# Patient Record
Sex: Female | Born: 1995
Health system: Southern US, Community
[De-identification: ages and names within clinical notes are randomized; demographics above are authoritative.]

## PROBLEM LIST (undated history)

## (undated) DIAGNOSIS — T7840XA Allergy, unspecified, initial encounter: Secondary | ICD-10-CM

## (undated) DIAGNOSIS — Z803 Family history of malignant neoplasm of breast: Secondary | ICD-10-CM

## (undated) DIAGNOSIS — J40 Bronchitis, not specified as acute or chronic: Secondary | ICD-10-CM

## (undated) DIAGNOSIS — G43909 Migraine, unspecified, not intractable, without status migrainosus: Secondary | ICD-10-CM

## (undated) DIAGNOSIS — Z8041 Family history of malignant neoplasm of ovary: Secondary | ICD-10-CM

## (undated) HISTORY — DX: Family history of malignant neoplasm of breast: Z80.3

## (undated) HISTORY — DX: Family history of malignant neoplasm of ovary: Z80.41

## (undated) HISTORY — DX: Migraine, unspecified, not intractable, without status migrainosus: G43.909

## (undated) HISTORY — DX: Allergy, unspecified, initial encounter: T78.40XA

---
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
## 2017-10-13 ENCOUNTER — Ambulatory Visit: Payer: Self-pay | Admitting: Physician Assistant

## 2018-08-30 ENCOUNTER — Emergency Department (HOSPITAL_COMMUNITY)
Admission: EM | Admit: 2018-08-30 | Discharge: 2018-08-30 | Disposition: A | Discharge status: Home / Self Care | Payer: No Typology Code available for payment source | Attending: Emergency Medicine | Admitting: Emergency Medicine

## 2018-08-30 ENCOUNTER — Emergency Department (HOSPITAL_COMMUNITY): Payer: No Typology Code available for payment source

## 2018-08-30 ENCOUNTER — Encounter (HOSPITAL_COMMUNITY): Payer: Self-pay | Admitting: Emergency Medicine

## 2018-08-30 ENCOUNTER — Other Ambulatory Visit: Payer: Self-pay

## 2018-08-30 DIAGNOSIS — M545 Low back pain, unspecified: Secondary | ICD-10-CM

## 2018-08-30 DIAGNOSIS — M542 Cervicalgia: Secondary | ICD-10-CM | POA: Diagnosis not present

## 2018-08-30 LAB — POC URINE PREG, ED: Preg Test, Ur: NEGATIVE

## 2018-08-30 NOTE — ED Notes (Signed)
Patient verbalizes understanding of discharge instructions. Opportunity for questioning and answers were provided. Armband removed by staff, pt discharged from ED. Ambulated out to lobby  

## 2018-08-30 NOTE — ED Notes (Signed)
Patient transported to X-ray 
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
## 2018-08-30 NOTE — Discharge Instructions (Addendum)

## 2019-03-16 ENCOUNTER — Encounter: Payer: Self-pay | Admitting: Emergency Medicine

## 2019-03-16 ENCOUNTER — Emergency Department
Admission: EM | Admit: 2019-03-16 | Discharge: 2019-03-16 | Disposition: A | Discharge status: Home / Self Care | Payer: Medicaid Other | Attending: Emergency Medicine | Admitting: Emergency Medicine

## 2019-03-16 ENCOUNTER — Other Ambulatory Visit: Payer: Self-pay

## 2019-03-16 DIAGNOSIS — H9202 Otalgia, left ear: Secondary | ICD-10-CM

## 2019-03-16 HISTORY — DX: Bronchitis, not specified as acute or chronic: J40

## 2019-03-16 NOTE — Discharge Instructions (Addendum)
You were seen today for left ear pain. There is no sign of infection. Start Flonase 1 spray left nare BID x 3 days. Can also use Sweet Oil in left ear and then plug with a cotton ball for comfort. Avoid use of Qtips. Follow up if pain persists or worsens.

## 2019-03-16 NOTE — ED Provider Notes (Signed)
[facility] Department Provider Note ____________________________________________  Time seen: 2051  I have reviewed the triage vital signs and the nursing notes.  HISTORY  Chief Complaint  Otalgia   HPI Karen Benton is a 23 y.o. female presents to the ER today with complaint of left ear pain.  She reports this started 2 to 3 days ago.  She reports the pain is minor.  She feels like she may have had fluid in her ear.  She denies decreased hearing or drainage from the left ear.  She denies runny nose, nasal congestion, ear pain, sore throat, cough or shortness of breath.  She denies fever, chills or body aches.  She has not taken anything over-the-counter for her symptoms.  Past Medical History:  Diagnosis Date  . Bronchitis     There are no active problems to display for this patient.   History reviewed. No pertinent surgical history.  Prior to Admission medications   Not on File    Allergies Patient has no known allergies.  History reviewed. No pertinent family history.  Social History Social History   Tobacco Use  . Smoking status: Never Smoker  . Smokeless tobacco: Never Used  Substance Use Topics  . Alcohol use: Yes    Frequency: Never  . Drug use: Never    Review of Systems  Constitutional: Negative for fever chills or body aches. ENT: Positive for ear pain.  Negative for runny nose, nasal congestion or sore throat. Cardiovascular: Negative for chest pain or chest tightness. Respiratory: Negative for cough or shortness of breath. Neurological: Negative for headaches, focal weakness or numbness. ____________________________________________  PHYSICAL EXAM:  VITAL SIGNS: ED Triage Vitals  Enc Vitals Group     BP 03/16/19 1943 139/67     Pulse Rate 03/16/19 1943 80     Resp 03/16/19 1943 18     Temp 03/16/19 1943 98.4 F (36.9 C)     Temp Source 03/16/19 1943 Oral     SpO2 03/16/19 1943 98 %     Weight 03/16/19  1944 170 lb (77.1 kg)     Height 03/16/19 1944 5\' 3"  (1.6 m)     Head Circumference --      Peak Flow --      Pain Score 03/16/19 1947 3     Pain Loc --      Pain Edu? --      Excl. in [place]? --     Constitutional: Alert and oriented. Well appearing and in no distress. Head: Normocephalic and atraumatic. Eyes: Conjunctivae are normal. PERRL. Normal extraocular movements Ears: Canals clear. TMs intact bilaterally.  + Effusion on the left. Dried blood noted in left outer ear canal, no active bleeding noted. Nose: No congestion/rhinorrhea/epistaxis. Mouth/Throat: Mucous membranes are moist. Hematological/Lymphatic/Immunological: No cervical lymphadenopathy. Cardiovascular: Normal rate, regular rhythm.  Respiratory: Normal respiratory effort. No wheezes/rales/rhonchi. Neurologic:  Normal gait without ataxia. Normal speech and language. No gross focal neurologic deficits are appreciated. Skin:  Skin is warm, dry and intact. No rash noted.  ______________________________________________  INITIAL IMPRESSION / ASSESSMENT AND PLAN / ED COURSE  Otalgia, Left Ear:  Appears to be trauma for possible Qtip No indication for abx at this time Start Flonase 1 spray left nare BID x 3 days Can use Sweet Oil and cotton ball in left ear for comfort ____________________________________________  FINAL CLINICAL IMPRESSION(S) / ED DIAGNOSES  Final diagnoses:  Otalgia of left ear   Nicki Reaperegina Baity, NP    [place]ity,  Coralie Keens, NP 03/16/19 2056    Arta Silence, MD 03/17/19 [phone]

## 2019-03-16 NOTE — ED Triage Notes (Signed)
Pt reports left earache for several days, worsening; no drainage; no fevers; pt in no acute distress

## 2020-01-07 ENCOUNTER — Encounter: Payer: Medicaid Other | Admitting: Obstetrics and Gynecology

## 2020-01-08 ENCOUNTER — Encounter: Payer: Self-pay | Admitting: Emergency Medicine

## 2020-01-08 ENCOUNTER — Emergency Department
Admission: EM | Admit: 2020-01-08 | Discharge: 2020-01-08 | Disposition: A | Discharge status: Home / Self Care | Payer: Medicaid Other | Source: Home / Self Care | Attending: Emergency Medicine | Admitting: Emergency Medicine

## 2020-01-08 ENCOUNTER — Other Ambulatory Visit: Payer: Self-pay

## 2020-01-08 ENCOUNTER — Emergency Department
Admission: EM | Admit: 2020-01-08 | Discharge: 2020-01-08 | Disposition: A | Discharge status: Home / Self Care | Payer: Medicaid Other | Attending: Student | Admitting: Student

## 2020-01-08 ENCOUNTER — Emergency Department: Payer: Medicaid Other

## 2020-01-08 DIAGNOSIS — Z3202 Encounter for pregnancy test, result negative: Secondary | ICD-10-CM | POA: Diagnosis not present

## 2020-01-08 DIAGNOSIS — N939 Abnormal uterine and vaginal bleeding, unspecified: Secondary | ICD-10-CM

## 2020-01-08 DIAGNOSIS — R102 Pelvic and perineal pain: Secondary | ICD-10-CM | POA: Insufficient documentation

## 2020-01-08 LAB — COMPREHENSIVE METABOLIC PANEL
ALT: 18 U/L (ref 0–44)
AST: 19 U/L (ref 15–41)
Albumin: 3.8 g/dL (ref 3.5–5.0)
Alkaline Phosphatase: 33 U/L — ABNORMAL LOW (ref 38–126)
Anion gap: 9 (ref 5–15)
BUN: 18 mg/dL (ref 6–20)
CO2: 29 mmol/L (ref 22–32)
Calcium: 9 mg/dL (ref 8.9–10.3)
Chloride: 105 mmol/L (ref 98–111)
Creatinine, Ser: 0.78 mg/dL (ref 0.44–1.00)
GFR calc Af Amer: >60 mL/min (ref >60)
GFR calc non Af Amer: >60 mL/min (ref >60)
Glucose, Bld: 103 mg/dL — ABNORMAL HIGH (ref 70–99)
Potassium: 3.9 mmol/L (ref 3.5–5.1)
Sodium: 143 mmol/L (ref 135–145)
Total Bilirubin: 0.4 mg/dL (ref 0.3–1.2)
Total Protein: 6.8 g/dL (ref 6.5–8.1)

## 2020-01-08 LAB — CBC WITH DIFFERENTIAL/PLATELET
Abs Immature Granulocytes: 0.04 10*3/uL (ref 0.00–0.07)
Basophils Absolute: 0 10*3/uL (ref 0.0–0.1)
Basophils Relative: 1 %
Eosinophils Absolute: 0.1 10*3/uL (ref 0.0–0.5)
Eosinophils Relative: 2 %
HCT: 39.1 % (ref 36.0–46.0)
Hemoglobin: 13.3 g/dL (ref 12.0–15.0)
Immature Granulocytes: 1 %
Lymphocytes Relative: 43 %
Lymphs Abs: 2.9 10*3/uL (ref 0.7–4.0)
MCH: 30.6 pg (ref 26.0–34.0)
MCHC: 34 g/dL (ref 30.0–36.0)
MCV: 89.9 fL (ref 80.0–100.0)
Monocytes Absolute: 0.5 10*3/uL (ref 0.1–1.0)
Monocytes Relative: 7 %
Neutro Abs: 3.3 10*3/uL (ref 1.7–7.7)
Neutrophils Relative %: 46 %
Platelets: 269 10*3/uL (ref 150–400)
RBC: 4.35 MIL/uL (ref 3.87–5.11)
RDW: 12.1 % (ref 11.5–15.5)
WBC: 6.9 10*3/uL (ref 4.0–10.5)
nRBC: 0 % (ref 0.0–0.2)

## 2020-01-08 LAB — URINALYSIS, COMPLETE (UACMP) WITH MICROSCOPIC
Bacteria, UA: NONE SEEN
Bilirubin Urine: NEGATIVE
Glucose, UA: NEGATIVE mg/dL
Ketones, ur: NEGATIVE mg/dL
Leukocytes,Ua: NEGATIVE
Nitrite: NEGATIVE
Protein, ur: NEGATIVE mg/dL
Specific Gravity, Urine: 1.027 (ref 1.005–1.030)
pH: 6 (ref 5.0–8.0)

## 2020-01-08 LAB — ABO/RH: ABO/RH(D): A POS

## 2020-01-08 LAB — POCT PREGNANCY, URINE: Preg Test, Ur: NEGATIVE

## 2020-01-08 LAB — HCG, QUANTITATIVE, PREGNANCY: hCG, Beta Chain, Quant, S: 1 m[IU]/mL (ref <5)

## 2020-01-08 MED ORDER — ONDANSETRON HCL 4 MG PO TABS: 4.0000 mg | ORAL_TABLET | Freq: Three times a day (TID) | ORAL | 0 refills | Status: AC | PRN

## 2020-01-08 NOTE — ED Triage Notes (Signed)
Pt reports was seen in ER this morning and had a positive test pregnancy, reports had vaginal bleeding but is better, pt reports she just wants to have a pelvic exam to make sure everything is ok  just having mild pain.

## 2020-01-08 NOTE — Discharge Instructions (Signed)
Please keep your scheduled appointment with gynecology.  Return to the emergency department if your bleeding becomes excessively heavy and you are unable to see gynecology.  Return to the emergency department if your pelvic pain increases and you are unable to see primary care or gynecology.

## 2020-01-08 NOTE — ED Provider Notes (Signed)
[facility] Emergency Department Provider Note  ____________________________________________  Time seen: Approximately 10:06 PM  I have reviewed the triage vital signs and the nursing notes.   HISTORY  Chief Complaint Pelvic Pain    HPI Karen Benton is a 24 y.o. female presents to the emergency department for treatment and evaluation of abdominal bloating and vaginal bleeding. She thought she was pregnant, but during her visit here this morning her urine pregnancy and beta HCG testing was negative. She had declined a pelvic exam at that time. Since going home, she has felt the abdominal bloating and vaginal bleeding and is now concerned that there may be a reason she can not conceive. Pelvic exam requested.  Past Medical History:  Diagnosis Date  . Bronchitis     There are no problems to display for this patient.   History reviewed. No pertinent surgical history.  Prior to Admission medications   Medication Sig Start Date End Date Taking? Authorizing Provider  ondansetron (ZOFRAN) 4 MG tablet Take 1 tablet (4 mg total) by mouth every 8 (eight) hours as needed for up to 7 days for nausea or vomiting. 01/08/20 01/15/20  Lilia Pro., MD    Allergies Patient has no known allergies.  No family history on file.  Social History Social History   Tobacco Use  . Smoking status: Never Smoker  . Smokeless tobacco: Never Used  Vaping Use  . Vaping Use: Never used  Substance Use Topics  . Alcohol use: Yes  . Drug use: Never    Review of Systems Constitutional: Negative for fever. Respiratory: Negative for shortness of breath or cough. Gastrointestinal: Negative for abdominal pain; negative for nausea , negative for vomiting. Genitourinary: Negative for dysuria , negative for vaginal discharge. Positive for vaginal bleeding. Musculoskeletal: Negative for back pain. Skin: Negative for acute skin  changes/rash/lesion. ____________________________________________   PHYSICAL EXAM:  VITAL SIGNS: ED Triage Vitals  Enc Vitals Group     BP 01/08/20 2122 118/72     Pulse Rate 01/08/20 2122 69     Resp 01/08/20 2122 20     Temp 01/08/20 2122 98.7 F (37.1 C)     Temp Source 01/08/20 2122 Oral     SpO2 01/08/20 2122 99 %     Weight 01/08/20 2123 185 lb (83.9 kg)     Height 01/08/20 2123 5\' 3"  (1.6 m)     Head Circumference --      Peak Flow --      Pain Score 01/08/20 2122 4     Pain Loc --      Pain Edu? --      Excl. in [place]? --     Constitutional: Alert and oriented. Well appearing and in no acute distress. Eyes: Conjunctivae are normal. Head: Atraumatic. Nose: No congestion/rhinnorhea. Mouth/Throat: Mucous membranes are moist. Respiratory: Normal respiratory effort.  No retractions. Gastrointestinal: Bowel sounds active x 4; Abdomen is soft without rebound or guarding. Genitourinary: Pelvic exam: n/a Musculoskeletal: No extremity tenderness nor edema.  Neurologic:  Normal speech and language. No gross focal neurologic deficits are appreciated. Speech is normal. No gait instability. Skin:  Skin is warm, dry and intact. No rash noted on exposed skin. Psychiatric: Mood and affect are normal. Speech and behavior are normal.  ____________________________________________   LABS (all labs ordered are listed, but only abnormal results are displayed)  Labs Reviewed - No data to display ____________________________________________  RADIOLOGY  Ultrasound of the pelvis with transvaginal images showed no  findings of concern. ____________________________________________  Procedures  ____________________________________________  24 year old female presenting to the emergency department for concerns regarding ability to get pregnant.  After discussion, we decided to proceed with pelvic ultrasound to look for ovarian cysts, endometriosis, uterine fibroids, or other  abnormalities.  Patient was advised that she will need to keep her scheduled appointment at the end of the month with gynecology as we do not do Pap smears.  The patient states that she has never had a Pap smear.  She also declines any concern for STD.  Prior to onset of vaginal bleeding she had no vaginal discharge.  Ultrasound is negative for findings of concern.  Patient will be discharged home and advised to keep her follow-up appointment with gynecology.  If vaginal bleeding becomes excessive or she begins to experience severe abdominal pain she is to return to the emergency department.  INITIAL IMPRESSION / ASSESSMENT AND PLAN / ED COURSE  Pertinent labs & imaging results that were available during my care of the patient were reviewed by me and considered in my medical decision making (see chart for details).  ____________________________________________   FINAL CLINICAL IMPRESSION(S) / ED DIAGNOSES  Final diagnoses:  Pelvic pain  Vaginal bleeding    Note:  This document was prepared using Dragon voice recognition software and may include unintentional dictation errors.   Chinita Pester, FNP 01/08/20 2352    Phineas Semen, MD 01/09/20 [phone]

## 2020-01-08 NOTE — Discharge Instructions (Signed)
Thank you for letting us take care of you in the emergency department today.  Please continue to take your prenatal vitamins.  Follow-up with OB/GYN doctors in the clinic at your upcoming appointment.  We will send you home with a prescription for Zofran to take as needed for nausea.  Please return to the ER for any new or worsening symptoms, including worsening abdominal pain, severe abdominal pain, heavy vaginal bleeding, excessive vomiting, or any other signs/symptoms that are concerning to you.

## 2020-01-08 NOTE — ED Triage Notes (Signed)
Patient ambulatory to triage with steady gait, without difficulty or distress noted, mask in place; pt reports vag bleeding & cramping since Friday; has had + home pregnancy test; G1

## 2020-01-08 NOTE — ED Notes (Signed)
Pt states she is having vaginal bleeding and pain.

## 2020-01-08 NOTE — ED Notes (Signed)
Dr. Monks at bedside 

## 2020-01-08 NOTE — ED Notes (Signed)
Pt updated in the WR, VS reassessed. 

## 2020-01-08 NOTE — ED Provider Notes (Signed)
[facility] Emergency Department Provider Note  ____________________________________________   First MD Initiated Contact with Patient 01/08/20 0802     (approximate)  I have reviewed the triage vital signs and the nursing notes.  History  Chief Complaint Vaginal Bleeding    HPI Karen Benton is a 24 y.o. female who presents to the emergency department for vaginal bleeding and positive home pregnancy test. Patient states she and her husband are actively trying. Last week she felt "different", and took two home pregnancy tests, one on Wednesday and one on Thursday, both of which were positive.  Her LMP was 5/15.  However, this Saturday she started having vaginal bleeding, lighter than her normal period. Mild lower abdominal cramping, similar to menses. No radiation, no alleviating/aggravating components. Some associated nausea with vomiting.  No other vaginal discharge.  No pelvic pain.  No concern for STD exposure.  Has a scheduled appointment with OB/GYN in the next few weeks. This is a desired pregnancy.    Past Medical Hx Past Medical History:  Diagnosis Date  . Bronchitis     Problem List There are no problems to display for this patient.   Past Surgical Hx History reviewed. No pertinent surgical history.  Medications Prior to Admission medications   Not on File    Allergies Patient has no known allergies.  Family Hx No family history on file.  Social Hx Social History   Tobacco Use  . Smoking status: Never Smoker  . Smokeless tobacco: Never Used  Vaping Use  . Vaping Use: Never used  Substance Use Topics  . Alcohol use: Yes  . Drug use: Never     Review of Systems  Constitutional: Negative for fever. Negative for chills. Eyes: Negative for visual changes. ENT: Negative for sore throat. Cardiovascular: Negative for chest pain. Respiratory: Negative for shortness of breath. Gastrointestinal: Negative for nausea. Negative  for vomiting.  Genitourinary: Negative for dysuria. + vaginal bleeding, positive home pregnancy test Musculoskeletal: Negative for leg swelling. Skin: Negative for rash. Neurological: Negative for headaches.   Physical Exam  Vital Signs: ED Triage Vitals  Enc Vitals Group     BP 01/08/20 0317 114/60     Pulse Rate 01/08/20 0317 78     Resp 01/08/20 0317 16     Temp 01/08/20 0317 98.7 F (37.1 C)     Temp Source 01/08/20 0317 Oral     SpO2 01/08/20 0317 98 %     Weight 01/08/20 0317 185 lb (83.9 kg)     Height 01/08/20 0317 5\' 3"  (1.6 m)     Head Circumference --      Peak Flow --      Pain Score 01/08/20 0316 1     Pain Loc --      Pain Edu? --      Excl. in [place]? --     Constitutional: Alert and oriented. Well appearing. NAD.  Head: Normocephalic. Atraumatic. Eyes: Conjunctivae clear. Sclera anicteric. Pupils equal and symmetric. Nose: No masses or lesions. No congestion or rhinorrhea. Mouth/Throat: Wearing mask.  Neck: No stridor. Trachea midline.  Cardiovascular: Normal rate, regular rhythm. Extremities well perfused. Respiratory: Normal respiratory effort.  Lungs CTAB. Gastrointestinal: Soft. Non-distended. Non-tender.  Genitourinary: Deferred. Offered to patient, but she would like defer to her appointment w/ OB/GYN on 6/29, feel this is reasonable.  Musculoskeletal: No lower extremity edema. No deformities. Neurologic:  Normal speech and language. No gross focal or lateralizing neurologic deficits are appreciated.  Skin: Skin is warm, dry and intact. No rash noted. Psychiatric: Mood and affect are appropriate for situation.  Appropriately tearful with results, coping appropriately.    Procedures  Procedure(s) performed (including critical care):  Procedures   Initial Impression / Assessment and Plan / MDM / ED Course  24 y.o. female who presents to the ED for vaginal bleeding in the setting of a positive home pregnancy test last week.  Ddx: ectopic,  miscarriage, threatened miscarriage, chemical pregnancy, false positive home pregnancy test  We will obtain blood work and urine studies  Patient's urine and beta hCG both negative for pregnancy.  Remainder of labs without actionable derangements.  Updated her on the results.  She is appropriately tearful as this was a desired pregnancy, but is coping appropriately.  Discussed that this could represent either a false positive home pregnancy test vs extremely early miscarriage vs chemical pregnancy.  She voices understanding of the situation.  Advise she keep her OB/GYN appointment at the end of this month to establish care and have a prenatal visit.  Did offer her a pelvic exam in the setting of her bleeding, however she would like to defer to her appointment, feel this is reasonable.  We will provide Zofran as needed for nausea, but otherwise feel she is stable for discharge with outpatient follow-up.  Discussed return precautions.   _______________________________   As part of my medical decision making I have reviewed available labs, radiology tests, reviewed old records/performed chart review.    Final Clinical Impression(s) / ED Diagnosis  Vaginal bleeding Positive home pregnancy test    Note:  This document was prepared using Dragon voice recognition software and may include unintentional dictation errors.   Lilia Pro., MD 01/08/20 [phone]

## 2020-01-21 ENCOUNTER — Encounter: Payer: Self-pay | Admitting: Obstetrics and Gynecology

## 2020-01-21 ENCOUNTER — Other Ambulatory Visit (HOSPITAL_COMMUNITY)
Admission: RE | Admit: 2020-01-21 | Discharge: 2020-01-21 | Disposition: A | Discharge status: Home / Self Care | Payer: Medicaid Other | Source: Ambulatory Visit | Attending: Obstetrics and Gynecology | Admitting: Obstetrics and Gynecology

## 2020-01-21 ENCOUNTER — Ambulatory Visit (INDEPENDENT_AMBULATORY_CARE_PROVIDER_SITE_OTHER): Payer: Medicaid Other | Admitting: Obstetrics and Gynecology

## 2020-01-21 ENCOUNTER — Other Ambulatory Visit: Payer: Self-pay

## 2020-01-21 DIAGNOSIS — Z113 Encounter for screening for infections with a predominantly sexual mode of transmission: Secondary | ICD-10-CM | POA: Insufficient documentation

## 2020-01-21 DIAGNOSIS — Z124 Encounter for screening for malignant neoplasm of cervix: Secondary | ICD-10-CM | POA: Insufficient documentation

## 2020-01-21 DIAGNOSIS — Z01419 Encounter for gynecological examination (general) (routine) without abnormal findings: Secondary | ICD-10-CM | POA: Diagnosis not present

## 2020-01-21 DIAGNOSIS — Z0000 Encounter for general adult medical examination without abnormal findings: Secondary | ICD-10-CM | POA: Diagnosis not present

## 2020-01-21 NOTE — Progress Notes (Signed)
Gynecology Annual Exam  PCP: Patient, No Pcp Per  Chief Complaint:  Chief Complaint  Patient presents with  . Gynecologic Exam    History of Present Illness: Patient is a 24 y.o. No obstetric history on file. presents for annual exam. The patient has no complaints today.   LMP: 01/07/2020  The patient is sexually active. She currently uses none for contraception. She denies dyspareunia.  The patient does perform self breast exams.  There is notable family history of breast or ovarian cancer in her family.  The patient denies current symptoms of depression.    Review of Systems: Review of Systems  Constitutional: Negative for chills, fever, malaise/fatigue and weight loss.  HENT: Negative for congestion, hearing loss and sinus pain.   Eyes: Negative for blurred vision and double vision.  Respiratory: Negative for cough, sputum production, shortness of breath and wheezing.   Cardiovascular: Negative for chest pain, palpitations, orthopnea and leg swelling.  Gastrointestinal: Negative for abdominal pain, constipation, diarrhea, nausea and vomiting.  Genitourinary: Negative for dysuria, flank pain, frequency, hematuria and urgency.  Musculoskeletal: Negative for back pain, falls and joint pain.  Skin: Negative for itching and rash.  Neurological: Negative for dizziness and headaches.  Psychiatric/Behavioral: Negative for depression, substance abuse and suicidal ideas. The patient is not nervous/anxious.     Past Medical History:  There are no problems to display for this patient.   Past Surgical History:  History reviewed. No pertinent surgical history.  Gynecologic History:  No LMP recorded. Contraception: none Last Pap: never  Obstetric History: No obstetric history on file.  Family History:  Family History  Problem Relation Age of Onset  . Breast cancer Mother 37       Aggressive Stage 4-4x  . Ovarian cancer Mother 92  . Breast cancer Maternal Grandmother 50    . Skin cancer Maternal Grandfather 77       Facial  . Breast cancer Maternal Aunt 68    Social History:  Social History   Socioeconomic History  . Marital status: Single    Spouse name: Not on file  . Number of children: Not on file  . Years of education: Not on file  . Highest education level: Not on file  Occupational History  . Not on file  Tobacco Use  . Smoking status: Never Smoker  . Smokeless tobacco: Never Used  Vaping Use  . Vaping Use: Never used  Substance and Sexual Activity  . Alcohol use: Yes  . Drug use: Never  . Sexual activity: Not on file  Other Topics Concern  . Not on file  Social History Narrative  . Not on file   Social Determinants of Health   Financial Resource Strain:   . Difficulty of Paying Living Expenses:   Food Insecurity:   . Worried About Programme researcher, broadcasting/film/video in the Last Year:   . Barista in the Last Year:   Transportation Needs:   . Freight forwarder (Medical):   Marland Kitchen Lack of Transportation (Non-Medical):   Physical Activity:   . Days of Exercise per Week:   . Minutes of Exercise per Session:   Stress:   . Feeling of Stress :   Social Connections:   . Frequency of Communication with Friends and Family:   . Frequency of Social Gatherings with Friends and Family:   . Attends Religious Services:   . Active Member of Clubs or Organizations:   . Attends Club or  Organization Meetings:   Marland Kitchen Marital Status:   Intimate Partner Violence:   . Fear of Current or Ex-Partner:   . Emotionally Abused:   Marland Kitchen Physically Abused:   . Sexually Abused:     Allergies:  No Known Allergies  Medications: Prior to Admission medications   Not on File    Physical Exam Vitals: There were no vitals taken for this visit.  General: NAD HEENT: normocephalic, anicteric Thyroid: no enlargement, no palpable nodules Pulmonary: No increased work of breathing, CTAB Cardiovascular: RRR, distal pulses 2+ Breast: Breast symmetrical, no  tenderness, no palpable nodules or masses, no skin or nipple retraction present, no nipple discharge.  No axillary or supraclavicular lymphadenopathy. Abdomen: NABS, soft, non-tender, non-distended.  Umbilicus without lesions.  No hepatomegaly, splenomegaly or masses palpable. No evidence of hernia  Genitourinary:  External: Normal external female genitalia.  Normal urethral meatus, normal Bartholin's and Skene's glands.    Vagina: Normal vaginal mucosa, no evidence of prolapse.    Cervix: Grossly normal in appearance, no bleeding  Uterus: Non-enlarged, mobile, normal contour.  No CMT  Adnexa: ovaries non-enlarged, no adnexal masses  Rectal: deferred  Lymphatic: no evidence of inguinal lymphadenopathy Extremities: no edema, erythema, or tenderness Neurologic: Grossly intact Psychiatric: mood appropriate, affect full  Female chaperone present for pelvic and breast  portions of the physical exam   Assessment: 24 y.o. No obstetric history on file. routine annual exam  Plan: Problem List Items Addressed This Visit    None    Visit Diagnoses    Health maintenance examination    -  Primary   Encounter for annual routine gynecological examination       Cervical cancer screening       Relevant Orders   Cytology - PAP   Screen for STD (sexually transmitted disease)       Relevant Orders   Cytology - PAP      1) STI screening  was offered and accepted.  2) Family history of breast cancer- genetic screening today, myrisk collected. Patient will return to discuss in detail.   3)  ASCCP guidelines and rational discussed.  Patient opts for every 3 years screening interval  4) Contraception - the patient is currently using  none.  She is attempting to conceive in the near future We discussed safe sex practices to reduce her furture risk of STI's.    5) Return in about 1 month (around 02/20/2020) for GYN visit to discuss myrisk result.  Adelene Idler MD, [facility], [facility] 01/21/2020 4:56 PM

## 2020-01-23 DIAGNOSIS — Z9189 Other specified personal risk factors, not elsewhere classified: Secondary | ICD-10-CM

## 2020-01-23 DIAGNOSIS — Z1371 Encounter for nonprocreative screening for genetic disease carrier status: Secondary | ICD-10-CM

## 2020-01-23 HISTORY — DX: Other specified personal risk factors, not elsewhere classified: Z91.89

## 2020-01-23 HISTORY — DX: Encounter for nonprocreative screening for genetic disease carrier status: Z13.71

## 2020-02-11 ENCOUNTER — Encounter: Payer: Self-pay | Admitting: Obstetrics and Gynecology

## 2020-02-24 ENCOUNTER — Other Ambulatory Visit: Payer: Self-pay | Admitting: Obstetrics and Gynecology

## 2020-02-24 DIAGNOSIS — Z803 Family history of malignant neoplasm of breast: Secondary | ICD-10-CM

## 2020-02-24 NOTE — Progress Notes (Signed)
MyRisk testing order placed. Done by Dr. Jerene Pitch 01/21/20 but order not in system.

## 2020-03-05 ENCOUNTER — Other Ambulatory Visit (HOSPITAL_COMMUNITY)
Admission: RE | Admit: 2020-03-05 | Discharge: 2020-03-05 | Disposition: A | Discharge status: Home / Self Care | Payer: Medicaid Other | Source: Ambulatory Visit | Attending: Obstetrics and Gynecology | Admitting: Obstetrics and Gynecology

## 2020-03-05 ENCOUNTER — Other Ambulatory Visit: Payer: Self-pay

## 2020-03-05 ENCOUNTER — Ambulatory Visit (INDEPENDENT_AMBULATORY_CARE_PROVIDER_SITE_OTHER): Payer: Medicaid Other | Admitting: Obstetrics and Gynecology

## 2020-03-05 ENCOUNTER — Encounter: Payer: Self-pay | Admitting: Obstetrics and Gynecology

## 2020-03-05 VITALS — BP 112/70 | Ht 63.0 in | Wt 195.6 lb

## 2020-03-05 DIAGNOSIS — Z124 Encounter for screening for malignant neoplasm of cervix: Secondary | ICD-10-CM

## 2020-03-05 DIAGNOSIS — Z3A08 8 weeks gestation of pregnancy: Secondary | ICD-10-CM

## 2020-03-05 DIAGNOSIS — Z3401 Encounter for supervision of normal first pregnancy, first trimester: Secondary | ICD-10-CM

## 2020-03-05 DIAGNOSIS — Z3403 Encounter for supervision of normal first pregnancy, third trimester: Secondary | ICD-10-CM | POA: Insufficient documentation

## 2020-03-05 DIAGNOSIS — Z113 Encounter for screening for infections with a predominantly sexual mode of transmission: Secondary | ICD-10-CM | POA: Insufficient documentation

## 2020-03-05 DIAGNOSIS — N926 Irregular menstruation, unspecified: Secondary | ICD-10-CM

## 2020-03-05 LAB — POCT URINE PREGNANCY: Preg Test, Ur: POSITIVE — AB

## 2020-03-05 NOTE — Progress Notes (Signed)
poc

## 2020-03-05 NOTE — Progress Notes (Signed)
03/05/2020   Chief Complaint: Missed period  Transfer of Care Patient: no  History of Present Illness: [name] is a 24 y.o. G1P0 82w6dbased on Patient's last menstrual period was 01/03/2020. with an Estimated Date of Delivery: 10/09/20, with the above CC.   Her periods were: regular periods  She was using no method when she conceived.  She has Positive signs or symptoms of nausea/vomiting of pregnancy. She has Negative signs or symptoms of miscarriage or preterm labor She was not taking different medications around the time she conceived/early pregnancy. Since her LMP, she has not used alcohol Since her LMP, she has not used tobacco products Since her LMP, she has not used illegal drugs.    Current or past history of domestic violence. no  Infection History:  1. Since her LMP, she has not had a viral illness.  2. She denies close contact with children on a regular basis  not applicable  3. She denies a history of chicken pox. She denies vaccination for chicken pox in the past. 4. Patient or partner has history of genital herpes  no 5. History of STI (GC, CT, HPV, syphilis, HIV)  no   6.  She does not live with someone with TB or TB exposed. 7. History of recent travel :  no 8. She identifies Negative Zika risk factors for her and her partner [id number] There are cats in the home in the home.  She understands that while pregnant she should not change cat litter.   Genetic Screening Questions: (Includes patient, baby's father, or anyone in either family)   1. [facility] 2. Th[place]semia ([place] or [place] background): MCV<80  no 3. Neural tube defect (meningomyelocele, spina bifida, anencephaly)  no 4. Congenital heart defect  no  5. Down syndrome  Yes, cousin with down syndrome 6. Tay-Sachs ([place], [place]  no 7. Canavan's Disease  no 8. Sickle cell disease or trait (African)  no  9. Hemophilia or other blood disorders  no  10.  Muscular dystrophy  no  11. Cystic fibrosis  no  12. Huntington's Chorea  no  13. Mental retardation/autism  no 14. Other inherited genetic or chromosomal disorder  no 15. Maternal metabolic disorder (DM, PKU, etc)  no 16. Patient or FOB with a child with a birth defect not listed above no  16a. Patient or FOB with a birth defect themselves no 17. Recurrent pregnancy loss, or stillbirth  no  18. Any medications since LMP other than prenatal vitamins (include vitamins, supplements, OTC meds, drugs, alcohol)  no 19. Any other genetic/environmental exposure to discuss  no  ROS:  Review of Systems  Constitutional: Negative for chills, fever, malaise/fatigue and weight loss.  HENT: Negative for congestion, hearing loss and sinus pain.   Eyes: Negative for blurred vision and double vision.  Respiratory: Negative for cough, sputum production, shortness of breath and wheezing.   Cardiovascular: Negative for chest pain, palpitations, orthopnea and leg swelling.  Gastrointestinal: Negative for abdominal pain, constipation, diarrhea, nausea and vomiting.  Genitourinary: Negative for dysuria, flank pain, frequency, hematuria and urgency.  Musculoskeletal: Negative for back pain, falls and joint pain.  Skin: Negative for itching and rash.  Neurological: Negative for dizziness and headaches.  Psychiatric/Behavioral: Negative for depression, substance abuse and suicidal ideas. The patient is not nervous/anxious.     OBGYN History: As per HPI. OB History  Gravida Para Term Preterm AB Living  1  SAB TAB Ectopic Multiple Live Births               # Outcome Date GA Lbr Len/2nd Weight Sex Delivery Anes PTL Lv  1 Current             Any issues with any prior pregnancies: no Any prior children are healthy, doing well, without any problems or issues: yes Last pap smear never History of STIs: no   Past Medical History: Past Medical History:  Diagnosis Date  . BRCA negative 01/2020    MyRisk neg  . Bronchitis   . Family history of breast cancer   . Family history of ovarian cancer   . Increased risk of breast cancer 01/2020   riskscore=25.8%/IBIS=33.8%    Past Surgical History: History reviewed. No pertinent surgical history.  Family History:  Family History  Problem Relation Age of Onset  . Breast cancer Mother 70       Aggressive Stage 4-4x  . Ovarian cancer Mother 13  . Breast cancer Maternal Grandmother 82  . Skin cancer Maternal Grandfather 73       Facial  . Breast cancer Maternal Aunt 60   She denies any female cancers, bleeding or blood clotting disorders.   Social History:  Social History   Socioeconomic History  . Marital status: Single    Spouse name: Not on file  . Number of children: Not on file  . Years of education: Not on file  . Highest education level: Not on file  Occupational History  . Not on file  Tobacco Use  . Smoking status: Never Smoker  . Smokeless tobacco: Never Used  Vaping Use  . Vaping Use: Never used  Substance and Sexual Activity  . Alcohol use: Yes  . Drug use: Never  . Sexual activity: Not on file  Other Topics Concern  . Not on file  Social History Narrative  . Not on file   Social Determinants of Health   Financial Resource Strain:   . Difficulty of Paying Living Expenses:   Food Insecurity:   . Worried About Charity fundraiser in the Last Year:   . Arboriculturist in the Last Year:   Transportation Needs:   . Film/video editor (Medical):   Marland Kitchen Lack of Transportation (Non-Medical):   Physical Activity:   . Days of Exercise per Week:   . Minutes of Exercise per Session:   Stress:   . Feeling of Stress :   Social Connections:   . Frequency of Communication with Friends and Family:   . Frequency of Social Gatherings with Friends and Family:   . Attends Religious Services:   . Active Member of Clubs or Organizations:   . Attends Archivist Meetings:   Marland Kitchen Marital Status:   Intimate  Partner Violence:   . Fear of Current or Ex-Partner:   . Emotionally Abused:   Marland Kitchen Physically Abused:   . Sexually Abused:     Allergy: No Known Allergies  Current Outpatient Medications: No current outpatient medications on file.   Physical Exam: Physical Exam Vitals and nursing note reviewed.  HENT:     Head: Normocephalic and atraumatic.  Eyes:     Pupils: Pupils are equal, round, and reactive to light.  Neck:     Thyroid: No thyromegaly.  Cardiovascular:     Rate and Rhythm: Normal rate and regular rhythm.  Pulmonary:     Effort: Pulmonary effort is normal.  Abdominal:  General: Bowel sounds are normal. There is no distension.     Palpations: Abdomen is soft.     Tenderness: There is no abdominal tenderness. There is no guarding or rebound.  Musculoskeletal:        General: Normal range of motion.     Cervical back: Normal range of motion and neck supple.  Skin:    General: Skin is warm and dry.  Neurological:     Mental Status: She is alert and oriented to person, place, and time.  Psychiatric:        Mood and Affect: Affect normal.        Judgment: Judgment normal.    Assessment: [name] is a 24 y.o. G1P0 42w6dbased on Patient's last menstrual period was 01/03/2020. with an Estimated Date of Delivery: 10/09/20,  for prenatal care.  Plan:  1) Avoid alcoholic beverages. 2) Patient encouraged not to smoke.  3) Discontinue the use of all non-medicinal drugs and chemicals.  4) Take prenatal vitamins daily.  5) Seatbelt use advised 6) Nutrition, food safety (fish, cheese advisories, and high nitrite foods) and exercise discussed. 7) [facility]  8) Patient is asked about travel to areas at risk for the ZArleyvirus, and counseled to avoid travel and exposure to mosquitoes or sexual partners who may have themselves been exposed to the virus. Testing is discussed, and will be ordered as appropriate.  9) [facility] 10) Genetic Screening, such as with 1st Trimester Screening, cell free fetal DNA, AFP testing, and Ultrasound, as well as with amniocentesis and CVS as appropriate, is discussed with patient. She plans to have genetic testing this pregnancy.   Will follow up in 2 weeks for [place] Discussed prenatal classes  Problem list reviewed and updated.  I discussed the assessment and treatment plan with the patient. The patient was provided an opportunity to ask questions and all were answered. The patient agreed with the plan and demonstrated an understanding of the instructions.  CAdrian ProwsMD [facility], [facility] 03/05/2020 1:55 PM

## 2020-03-05 NOTE — Patient Instructions (Signed)
First Trimester of Pregnancy The first trimester of pregnancy is from week 1 until the end of week 13 (months 1 through 3). A week after a sperm fertilizes an egg, the egg will implant on the wall of the uterus. This embryo will begin to develop into a baby. Genes from you and your partner will form the baby. The female genes will determine whether the baby will be a boy or a girl. At 6-8 weeks, the eyes and face will be formed, and the heartbeat can be seen on ultrasound. At the end of 12 weeks, all the baby's organs will be formed. Now that you are pregnant, you will want to do everything you can to have a healthy baby. Two of the most important things are to get good prenatal care and to follow your health care provider's instructions. Prenatal care is all the medical care you receive before the baby's birth. This care will help prevent, find, and treat any problems during the pregnancy and childbirth. Body changes during your first trimester Your body goes through many changes during pregnancy. The changes vary from woman to woman.  You may gain or lose a couple of pounds at first.  You may feel sick to your stomach (nauseous) and you may throw up (vomit). If the vomiting is uncontrollable, call your health care provider.  You may tire easily.  You may develop headaches that can be relieved by medicines. All medicines should be approved by your health care provider.  You may urinate more often. Painful urination may mean you have a bladder infection.  You may develop heartburn as a result of your pregnancy.  You may develop constipation because certain hormones are causing the muscles that push stool through your intestines to slow down.  You may develop hemorrhoids or swollen veins (varicose veins).  Your breasts may begin to grow larger and become tender. Your nipples may stick out more, and the tissue that surrounds them (areola) may become darker.  Your gums may bleed and may be  sensitive to brushing and flossing.  Dark spots or blotches (chloasma, mask of pregnancy) may develop on your face. This will likely fade after the baby is born.  Your menstrual periods will stop.  You may have a loss of appetite.  You may develop cravings for certain kinds of food.  You may have changes in your emotions from day to day, such as being excited to be pregnant or being concerned that something may go wrong with the pregnancy and baby.  You may have more vivid and strange dreams.  You may have changes in your hair. These can include thickening of your hair, rapid growth, and changes in texture. Some women also have hair loss during or after pregnancy, or hair that feels dry or thin. Your hair will most likely return to normal after your baby is born. What to expect at prenatal visits During a routine prenatal visit:  You will be weighed to make sure you and the baby are growing normally.  Your blood pressure will be taken.  Your abdomen will be measured to track your baby's growth.  The fetal heartbeat will be listened to between weeks 10 and 14 of your pregnancy.  Test results from any previous visits will be discussed. Your health care provider may ask you:  How you are feeling.  If you are feeling the baby move.  If you have had any abnormal symptoms, such as leaking fluid, bleeding, severe headaches, or abdominal   cramping.  If you are using any tobacco products, including cigarettes, chewing tobacco, and electronic cigarettes.  If you have any questions. Other tests that may be performed during your first trimester include:  Blood tests to find your blood type and to check for the presence of any previous infections. The tests will also be used to check for low iron levels (anemia) and protein on red blood cells (Rh antibodies). Depending on your risk factors, or if you previously had diabetes during pregnancy, you may have tests to check for high blood sugar  that affects pregnant women (gestational diabetes).  Urine tests to check for infections, diabetes, or protein in the urine.  An ultrasound to confirm the proper growth and development of the baby.  Fetal screens for spinal cord problems (spina bifida) and Down syndrome.  HIV (human immunodeficiency virus) testing. Routine prenatal testing includes screening for HIV, unless you choose not to have this test.  You may need other tests to make sure you and the baby are doing well. Follow these instructions at home: Medicines  Follow your health care provider's instructions regarding medicine use. Specific medicines may be either safe or unsafe to take during pregnancy.  Take a prenatal vitamin that contains at least 600 micrograms (mcg) of folic acid.  If you develop constipation, try taking a stool softener if your health care provider approves. Eating and drinking   Eat a balanced diet that includes fresh fruits and vegetables, whole grains, good sources of protein such as meat, eggs, or tofu, and low-fat dairy. Your health care provider will help you determine the amount of weight gain that is right for you.  Avoid raw meat and uncooked cheese. These carry germs that can cause birth defects in the baby.  Eating four or five small meals rather than three large meals a day may help relieve nausea and vomiting. If you start to feel nauseous, eating a few soda crackers can be helpful. Drinking liquids between meals, instead of during meals, also seems to help ease nausea and vomiting.  Limit foods that are high in fat and processed sugars, such as fried and sweet foods.  To prevent constipation: ? Eat foods that are high in fiber, such as fresh fruits and vegetables, whole grains, and beans. ? Drink enough fluid to keep your urine clear or pale yellow. Activity  Exercise only as directed by your health care provider. Most women can continue their usual exercise routine during  pregnancy. Try to exercise for 30 minutes at least 5 days a week. Exercising will help you: ? Control your weight. ? Stay in shape. ? Be prepared for labor and delivery.  Experiencing pain or cramping in the lower abdomen or lower back is a good sign that you should stop exercising. Check with your health care provider before continuing with normal exercises.  Try to avoid standing for long periods of time. Move your legs often if you must stand in one place for a long time.  Avoid heavy lifting.  Wear low-heeled shoes and practice good posture.  You may continue to have sex unless your health care provider tells you not to. Relieving pain and discomfort  Wear a good support bra to relieve breast tenderness.  Take warm sitz baths to soothe any pain or discomfort caused by hemorrhoids. Use hemorrhoid cream if your health care provider approves.  Rest with your legs elevated if you have leg cramps or low back pain.  If you develop varicose veins in   your legs, wear support hose. Elevate your feet for 15 minutes, 3-4 times a day. Limit salt in your diet. Prenatal care  Schedule your prenatal visits by the twelfth week of pregnancy. They are usually scheduled monthly at first, then more often in the last 2 months before delivery.  Write down your questions. Take them to your prenatal visits.  Keep all your prenatal visits as told by your health care provider. This is important. Safety  Wear your seat belt at all times when driving.  Make a list of emergency phone numbers, including numbers for family, friends, the hospital, and police and fire departments. General instructions  Ask your health care provider for a referral to a local prenatal education class. Begin classes no later than the beginning of month 6 of your pregnancy.  Ask for help if you have counseling or nutritional needs during pregnancy. Your health care provider can offer advice or refer you to specialists for help  with various needs.  Do not use hot tubs, steam rooms, or saunas.  Do not douche or use tampons or scented sanitary pads.  Do not cross your legs for long periods of time.  Avoid cat litter boxes and soil used by cats. These carry germs that can cause birth defects in the baby and possibly loss of the fetus by miscarriage or stillbirth.  Avoid all smoking, herbs, alcohol, and medicines not prescribed by your health care provider. Chemicals in these products affect the formation and growth of the baby.  Do not use any products that contain nicotine or tobacco, such as cigarettes and e-cigarettes. If you need help quitting, ask your health care provider. You may receive counseling support and other resources to help you quit.  Schedule a dentist appointment. At home, brush your teeth with a soft toothbrush and be gentle when you floss. Contact a health care provider if:  You have dizziness.  You have mild pelvic cramps, pelvic pressure, or nagging pain in the abdominal area.  You have persistent nausea, vomiting, or diarrhea.  You have a bad smelling vaginal discharge.  You have pain when you urinate.  You notice increased swelling in your face, hands, legs, or ankles.  You are exposed to fifth disease or chickenpox.  You are exposed to German measles (rubella) and have never had it. Get help right away if:  You have a fever.  You are leaking fluid from your vagina.  You have spotting or bleeding from your vagina.  You have severe abdominal cramping or pain.  You have rapid weight gain or loss.  You vomit blood or material that looks like coffee grounds.  You develop a severe headache.  You have shortness of breath.  You have any kind of trauma, such as from a fall or a car accident. Summary  The first trimester of pregnancy is from week 1 until the end of week 13 (months 1 through 3).  Your body goes through many changes during pregnancy. The changes vary from  woman to woman.  You will have routine prenatal visits. During those visits, your health care provider will examine you, discuss any test results you may have, and talk with you about how you are feeling. This information is not intended to replace advice given to you by your health care provider. Make sure you discuss any questions you have with your health care provider. Document Revised: 06/23/2017 Document Reviewed: 06/22/2016 Elsevier Patient Education  2020 [employer]  

## 2020-03-06 LAB — RPR+RH+ABO+RUB AB+AB SCR+CB...
Antibody Screen: NEGATIVE
HIV Screen 4th Generation wRfx: NONREACTIVE
Hematocrit: 40.2 % (ref 34.0–46.6)
Hemoglobin: 13.2 g/dL (ref 11.1–15.9)
Hepatitis B Surface Ag: NEGATIVE
MCH: 29.1 pg (ref 26.6–33.0)
MCHC: 32.8 g/dL (ref 31.5–35.7)
MCV: 89 fL (ref 79–97)
Platelets: 278 10*3/uL (ref 150–450)
RBC: 4.53 x10E6/uL (ref 3.77–5.28)
RDW: 11.9 % (ref 11.7–15.4)
RPR Ser Ql: NONREACTIVE
Rh Factor: POSITIVE
Rubella Antibodies, IGG: 2.77 index (ref >0.99)
Varicella zoster IgG: <135 index — ABNORMAL LOW (ref >165)
WBC: 12.3 10*3/uL — ABNORMAL HIGH (ref 3.4–10.8)

## 2020-03-06 LAB — HEPATITIS C ANTIBODY: Hep C Virus Ab: <0.1 s/co ratio (ref 0.0–0.9)

## 2020-03-11 LAB — CYTOLOGY - PAP
Chlamydia: NEGATIVE
Comment: NEGATIVE
Comment: NEGATIVE
Comment: NORMAL
Diagnosis: NEGATIVE
Neisseria Gonorrhea: NEGATIVE
Trichomonas: NEGATIVE

## 2020-03-12 NOTE — Progress Notes (Signed)
Called and discussed results with patient.

## 2020-04-01 ENCOUNTER — Other Ambulatory Visit: Payer: Self-pay

## 2020-04-01 ENCOUNTER — Telehealth: Payer: Self-pay

## 2020-04-01 ENCOUNTER — Emergency Department: Payer: Medicaid Other

## 2020-04-01 DIAGNOSIS — Z3A12 12 weeks gestation of pregnancy: Secondary | ICD-10-CM | POA: Diagnosis not present

## 2020-04-01 DIAGNOSIS — Z5321 Procedure and treatment not carried out due to patient leaving prior to being seen by health care provider: Secondary | ICD-10-CM | POA: Insufficient documentation

## 2020-04-01 DIAGNOSIS — O26891 Other specified pregnancy related conditions, first trimester: Secondary | ICD-10-CM | POA: Diagnosis present

## 2020-04-01 DIAGNOSIS — R109 Unspecified abdominal pain: Secondary | ICD-10-CM | POA: Insufficient documentation

## 2020-04-01 LAB — CBC
HCT: 36.9 % (ref 36.0–46.0)
Hemoglobin: 12.8 g/dL (ref 12.0–15.0)
MCH: 30.5 pg (ref 26.0–34.0)
MCHC: 34.7 g/dL (ref 30.0–36.0)
MCV: 87.9 fL (ref 80.0–100.0)
Platelets: 248 10*3/uL (ref 150–400)
RBC: 4.2 MIL/uL (ref 3.87–5.11)
RDW: 12 % (ref 11.5–15.5)
WBC: 10.4 10*3/uL (ref 4.0–10.5)
nRBC: 0 % (ref 0.0–0.2)

## 2020-04-01 LAB — COMPREHENSIVE METABOLIC PANEL
ALT: 17 U/L (ref 0–44)
AST: 21 U/L (ref 15–41)
Albumin: 3.3 g/dL — ABNORMAL LOW (ref 3.5–5.0)
Alkaline Phosphatase: 26 U/L — ABNORMAL LOW (ref 38–126)
Anion gap: 8 (ref 5–15)
BUN: 10 mg/dL (ref 6–20)
CO2: 22 mmol/L (ref 22–32)
Calcium: 8.8 mg/dL — ABNORMAL LOW (ref 8.9–10.3)
Chloride: 104 mmol/L (ref 98–111)
Creatinine, Ser: 0.57 mg/dL (ref 0.44–1.00)
GFR calc Af Amer: >60 mL/min (ref >60)
GFR calc non Af Amer: >60 mL/min (ref >60)
Glucose, Bld: 126 mg/dL — ABNORMAL HIGH (ref 70–99)
Potassium: 3.7 mmol/L (ref 3.5–5.1)
Sodium: 134 mmol/L — ABNORMAL LOW (ref 135–145)
Total Bilirubin: 0.6 mg/dL (ref 0.3–1.2)
Total Protein: 6.6 g/dL (ref 6.5–8.1)

## 2020-04-01 LAB — LIPASE, BLOOD: Lipase: 27 U/L (ref 11–51)

## 2020-04-01 LAB — HCG, QUANTITATIVE, PREGNANCY: hCG, Beta Chain, Quant, S: 109155 m[IU]/mL — ABNORMAL HIGH (ref <5)

## 2020-04-01 NOTE — ED Triage Notes (Addendum)
Pt comes POV with right sided abdominal pain. [redacted] weeks pregnant. States it feels like a bruise. States this this morning "popped blood vessels" showed up on right side. Small, barely visible petechiae noticed.

## 2020-04-01 NOTE — Telephone Encounter (Signed)
I do not have any records of covid tests to provide her with a return to work note.  When did her home contact test positive for COVID? She should isolate for 14 days after a home exposure.

## 2020-04-01 NOTE — Telephone Encounter (Signed)
Pt calling; has appt 9/15th; someone she lives with has recently had covid; pt tested neg; person tested neg this past week; her work needs a return to work note; we are her only provider she sees.  Can we give her a note to return to work or do we need to see her?  [phone]  Pt aware [facility] not in office until 04/02/20 and msg will be forwarded to her.

## 2020-04-02 ENCOUNTER — Emergency Department
Admission: EM | Admit: 2020-04-02 | Discharge: 2020-04-02 | Disposition: A | Discharge status: Home / Self Care | Payer: Medicaid Other | Attending: Emergency Medicine | Admitting: Emergency Medicine

## 2020-04-02 NOTE — Telephone Encounter (Signed)
LMTC

## 2020-04-07 NOTE — Telephone Encounter (Signed)
Pt has appt 9/15 c [facility].

## 2020-04-08 ENCOUNTER — Other Ambulatory Visit: Payer: Self-pay

## 2020-04-08 ENCOUNTER — Ambulatory Visit (INDEPENDENT_AMBULATORY_CARE_PROVIDER_SITE_OTHER): Payer: Medicaid Other

## 2020-04-08 ENCOUNTER — Encounter: Payer: Self-pay | Admitting: Obstetrics and Gynecology

## 2020-04-08 ENCOUNTER — Ambulatory Visit (INDEPENDENT_AMBULATORY_CARE_PROVIDER_SITE_OTHER): Payer: Medicaid Other | Admitting: Obstetrics and Gynecology

## 2020-04-08 VITALS — BP 114/70 | Ht 63.0 in | Wt 200.6 lb

## 2020-04-08 DIAGNOSIS — Z3A13 13 weeks gestation of pregnancy: Secondary | ICD-10-CM

## 2020-04-08 DIAGNOSIS — Z3401 Encounter for supervision of normal first pregnancy, first trimester: Secondary | ICD-10-CM | POA: Diagnosis not present

## 2020-04-08 DIAGNOSIS — Z1379 Encounter for other screening for genetic and chromosomal anomalies: Secondary | ICD-10-CM

## 2020-04-08 LAB — POCT URINALYSIS DIPSTICK OB
Glucose, UA: NEGATIVE
POC,PROTEIN,UA: NEGATIVE

## 2020-04-08 NOTE — Patient Instructions (Signed)

## 2020-04-08 NOTE — Progress Notes (Signed)
Routine Prenatal Care Visit  Subjective  Karen Benton is a 24 y.o. G1P0 at [redacted]w[redacted]d being seen today for ongoing prenatal care.  She is currently monitored for the following issues for this low-risk pregnancy and has Encounter for supervision of normal first pregnancy in first trimester on their problem list.  ----------------------------------------------------------------------------------- Patient reports vague right sided pain with popped blood vessels when laying on right side. No dysuria, no hematuria. NO CVA tenderness on exam.   Contractions: Not present. Vag. Bleeding: None.  Movement: Absent. Denies leaking of fluid.  ----------------------------------------------------------------------------------- The following portions of the patient's history were reviewed and updated as appropriate: allergies, current medications, past family history, past medical history, past social history, past surgical history and problem list. Problem list updated.   Objective  Blood pressure 114/70, height 5\' 3"  (1.6 m), weight 200 lb 9.6 oz (91 kg), last menstrual period 01/03/2020. Pregravid weight Pregravid weight not on file Total Weight Gain Not found. Urinalysis:      Fetal Status: Fetal Heart Rate (bpm): 160   Movement: Absent     General:  Alert, oriented and cooperative. Patient is in no acute distress.  Skin: Skin is warm and dry. No rash noted.   Cardiovascular: Normal heart rate noted  Respiratory: Normal respiratory effort, no problems with respiration noted  Abdomen: Soft, gravid, appropriate for gestational age. Pain/Pressure: Absent     Pelvic:  Cervical exam deferred        Extremities: Normal range of motion.     Mental Status: Normal mood and affect. Normal behavior. Normal judgment and thought content.     Assessment   24 y.o. G1P0 at [redacted]w[redacted]d by  10/09/2020, by Last Menstrual Period presenting for routine prenatal visit  Plan   pregnancy 1 Problems (from 03/05/20 to  present)    Problem Noted Resolved   Encounter for supervision of normal first pregnancy in first trimester 03/05/2020 by 05/05/2020, MD No   Overview Addendum 04/08/2020 10:36 AM by 04/10/2020, MD     Nursing Staff Provider  Office Location  [facility] Dating   EDD= 13 wk Natale Milch  Language  English Anatomy US    Flu Vaccine   Genetic Screen  NIPS:   AFP:   First Screen:    TDaP vaccine    Hgb A1C or  GTT Early : Third trimester :   Rhogam   not needed   LAB RESULTS   Feeding Plan  Blood Type A/Positive/-- (08/12 1437)   Contraception  Antibody Negative (08/12 1437)  Circumcision  Rubella 2.77 (08/12 1437)  Pediatrician   RPR Non Reactive (08/12 1437)   Support Person  HBsAg Negative (08/12 1437)   Prenatal Classes  HIV Non Reactive (08/12 1437)    Varicella  non-immune  BTL Consent  GBS  (For PCN allergy, check sensitivities)        VBAC Consent  Pap   NIL    Hgb Electro      CF      SMA               Previous Version       Counseled on round ligament pain [id number] testing today- patient has gender reveal planned.  Discussed covid vaccination- encouraged to be vaccinated. Given handout.   Gestational age appropriate obstetric precautions including but not limited to vaginal bleeding, contractions, leaking of fluid and fetal movement were reviewed in detail with the patient.    Return in about  4 weeks (around 05/06/2020) for ROB in person.  Natale Milch MD [facility], [facility] 04/08/2020, 10:42 AM

## 2020-04-13 LAB — MATERNIT21 PLUS CORE+SCA
Fetal Fraction: 6
Monosomy X (Turner Syndrome): NOT DETECTED
Result (T21): NEGATIVE
Trisomy 13 (Patau syndrome): NEGATIVE
Trisomy 18 (Edwards syndrome): NEGATIVE
Trisomy 21 (Down syndrome): NEGATIVE
XXX (Triple X Syndrome): NOT DETECTED
XXY (Klinefelter Syndrome): NOT DETECTED
XYY (Jacobs Syndrome): NOT DETECTED

## 2020-05-06 ENCOUNTER — Encounter: Payer: Self-pay | Admitting: Obstetrics and Gynecology

## 2020-05-06 ENCOUNTER — Ambulatory Visit (INDEPENDENT_AMBULATORY_CARE_PROVIDER_SITE_OTHER): Payer: Medicaid Other | Admitting: Obstetrics and Gynecology

## 2020-05-06 ENCOUNTER — Other Ambulatory Visit: Payer: Self-pay

## 2020-05-06 VITALS — BP 106/70 | Ht 63.0 in | Wt 202.2 lb

## 2020-05-06 DIAGNOSIS — Z3A17 17 weeks gestation of pregnancy: Secondary | ICD-10-CM

## 2020-05-06 DIAGNOSIS — Z3402 Encounter for supervision of normal first pregnancy, second trimester: Secondary | ICD-10-CM

## 2020-05-06 NOTE — Progress Notes (Signed)
    Routine Prenatal Care Visit  Subjective  Karen Benton is a 24 y.o. G1P0 at [redacted]w[redacted]d being seen today for ongoing prenatal care.  She is currently monitored for the following issues for this low-risk pregnancy and has Encounter for supervision of normal first pregnancy in first trimester on their problem list.  ----------------------------------------------------------------------------------- Patient reports no complaints.   Contractions: Not present. Vag. Bleeding: None.  Movement: Present. Denies leaking of fluid.  ----------------------------------------------------------------------------------- The following portions of the patient's history were reviewed and updated as appropriate: allergies, current medications, past family history, past medical history, past social history, past surgical history and problem list. Problem list updated.   Objective  Blood pressure 106/70, height 5\' 3"  (1.6 m), weight 202 lb 3.2 oz (91.7 kg), last menstrual period 01/03/2020. Pregravid weight Pregravid weight not on file Total Weight Gain Not found. Urinalysis:      Fetal Status: Fetal Heart Rate (bpm): 145 Fundal Height: 20 cm Movement: Present     General:  Alert, oriented and cooperative. Patient is in no acute distress.  Skin: Skin is warm and dry. No rash noted.   Cardiovascular: Normal heart rate noted  Respiratory: Normal respiratory effort, no problems with respiration noted  Abdomen: Soft, gravid, appropriate for gestational age. Pain/Pressure: Absent     Pelvic:  Cervical exam deferred        Extremities: Normal range of motion.     Mental Status: Normal mood and affect. Normal behavior. Normal judgment and thought content.     Assessment   24 y.o. G1P0 at [redacted]w[redacted]d by  10/09/2020, by Last Menstrual Period presenting for routine prenatal visit  Plan   pregnancy 1 Problems (from 03/05/20 to present)    Problem Noted Resolved   Encounter for supervision of normal first pregnancy in  first trimester 03/05/2020 by 05/05/2020, MD No   Overview Addendum 04/08/2020 10:36 AM by 04/10/2020, MD     Nursing Staff Provider  Office Location  [facility] Dating   EDD= 13 wk Natale Milch  Language  English Anatomy US    Flu Vaccine   Genetic Screen  NIPS:   AFP:   First Screen:    TDaP vaccine    Hgb A1C or  GTT Early : Third trimester :   Rhogam   not needed   LAB RESULTS   Feeding Plan  Blood Type A/Positive/-- (08/12 1437)   Contraception  Antibody Negative (08/12 1437)  Circumcision  Rubella 2.77 (08/12 1437)  Pediatrician   RPR Non Reactive (08/12 1437)   Support Person  HBsAg Negative (08/12 1437)   Prenatal Classes  HIV Non Reactive (08/12 1437)    Varicella  non-immune  BTL Consent  GBS  (For PCN allergy, check sensitivities)        VBAC Consent  Pap   NIL    Hgb Electro      CF      SMA               Previous Version       Gestational age appropriate obstetric precautions including but not limited to vaginal bleeding, contractions, leaking of fluid and fetal movement were reviewed in detail with the patient.    Return in about 2 weeks (around 05/20/2020) for ROB and anatomy 05/22/2020.  [place] MD [facility], [facility] 05/06/2020, 4:28 PM

## 2020-05-06 NOTE — Patient Instructions (Signed)

## 2020-05-27 ENCOUNTER — Ambulatory Visit (INDEPENDENT_AMBULATORY_CARE_PROVIDER_SITE_OTHER): Payer: Medicaid Other

## 2020-05-27 ENCOUNTER — Encounter: Payer: Self-pay | Admitting: Obstetrics & Gynecology

## 2020-05-27 ENCOUNTER — Ambulatory Visit (INDEPENDENT_AMBULATORY_CARE_PROVIDER_SITE_OTHER): Payer: Medicaid Other | Admitting: Obstetrics & Gynecology

## 2020-05-27 ENCOUNTER — Other Ambulatory Visit: Payer: Self-pay

## 2020-05-27 VITALS — BP 100/70 | Wt 209.0 lb

## 2020-05-27 DIAGNOSIS — Z131 Encounter for screening for diabetes mellitus: Secondary | ICD-10-CM

## 2020-05-27 DIAGNOSIS — Z3402 Encounter for supervision of normal first pregnancy, second trimester: Secondary | ICD-10-CM

## 2020-05-27 DIAGNOSIS — Z3A20 20 weeks gestation of pregnancy: Secondary | ICD-10-CM

## 2020-05-27 LAB — POCT URINALYSIS DIPSTICK OB
Glucose, UA: NEGATIVE
POC,PROTEIN,UA: NEGATIVE

## 2020-05-27 NOTE — Progress Notes (Signed)
  Subjective  Fetal Movement? yes Contractions? no Leaking Fluid? no Vaginal Bleeding? no  Review of ULTRASOUND. I have personally reviewed images and report of recent ultrasound done at [facility]. There is a singleton gestation with subjectively normal amniotic fluid volume. The fetal biometry correlates with established dating. Detailed evaluation of the fetal anatomy was performed.The fetal anatomical survey appears within normal limits within the resolution of ultrasound as described above.  It must be noted that a normal ultrasound is unable to rule out fetal aneuploidy.    Objective  BP 100/70   Wt 209 lb (94.8 kg)   LMP 01/03/2020   BMI 37.02 kg/m  General: NAD Pumonary: no increased work of breathing Abdomen: gravid, non-tender Extremities: no edema Psychiatric: mood appropriate, affect full  Assessment  24 y.o. G1P0 at [redacted]w[redacted]d by  10/09/2020, by Last Menstrual Period presenting for routine prenatal visit  Plan   Problem List Items Addressed This Visit      Other   Encounter for supervision of normal first pregnancy in first trimester    Other Visit Diagnoses    [redacted] weeks gestation of pregnancy    -  Primary   Screening for diabetes mellitus       Relevant Orders   28 Week RH+Panel      pregnancy 1 Problems (from 03/05/20 to present)    Problem Noted Resolved   Encounter for supervision of normal first pregnancy in first trimester 03/05/2020 by Natale Milch, MD No   Overview Addendum 05/27/2020 11:54 AM by Nadara Mustard, MD     Nursing Staff Provider  Office Location  [facility] Dating   EDD= 13 wk [place]  Language  English Anatomy US  [facility] nml  Flu Vaccine   Genetic Screen  decl   TDaP vaccine    Hgb A1C or  GTT Third trimester :   Rhogam   not needed   LAB RESULTS   Feeding Plan  Blood Type A/Positive/-- (08/12 1437)   Contraception  Antibody Negative (08/12 1437)  Circumcision  Rubella 2.77 (08/12 1437)  Pediatrician   RPR Non Reactive (08/12 1437)    Support Person  HBsAg Negative (08/12 1437)   Prenatal Classes  HIV Non Reactive (08/12 1437)    Varicella  non-immune  BTL Consent n/a GBS  (For PCN allergy, check sensitivities)        VBAC Consent n/a Pap   NIL    Hgb Electro      CF      SMA               Previous Version       Annamarie Major, MD, [facility], [facility] 05/27/2020  12:01 PM

## 2020-05-27 NOTE — Addendum Note (Signed)
Addended by: Cornelius Moras D on: 05/27/2020 12:59 PM   Modules accepted: Orders

## 2020-05-27 NOTE — Patient Instructions (Signed)
Thank you for choosing [facility]. As part of our ongoing efforts to improve patient experience, we would appreciate your feedback. Please fill out the short survey that you will receive by mail or MyChart. Your opinion is important to us! -Dr Verrastro  Prenatal Ultrasound A prenatal ultrasound exam, also called a sonogram, is an imaging test that allows your health care provider to see your baby and placenta in the uterus. This is a safe and painless test that does not expose you or your baby to any X-rays, needles, or medicines. Prenatal ultrasounds are done using a handheld plastic device (transducer) that sends out sound waves (ultrasound). The sound waves reflect off your baby's bones and other tissues to create moving images on a computer screen. There are two types of prenatal ultrasound:  Transabdominal ultrasound. During this test, a transducer is placed on your belly and moved around. A routine transabdominal ultrasound is usually done between weeks 18 and 22 of pregnancy (standard ultrasound). It may also be done between weeks 13 and 14.  Transvaginal ultrasound. During this test, a transducer that is shaped like a wand is placed inside your vagina. This type of ultrasound is usually done during early pregnancy. Prenatal ultrasounds may be used to check:  How far along your pregnancy is (stage).  Your baby's development (gestational age).  The location and condition of the organ that supplies your baby with nourishment and oxygen (placenta).  Your baby's heart rate, position, and movements.  Your baby's approximate size and weight.  The amount of fluid surrounding your baby (amniotic fluid).  If you are carrying more than one baby.  Your baby's sex (if your baby is in a position that allows the sex organs to be seen, and if you choose to learn the sex at this time).  If there are any possible problems that require more testing, such as genetic problems.  If your pregnancy  is forming outside your uterus (ectopic pregnancy). You may have other ultrasounds as needed at any point during your pregnancy. If your health care provider suspects a problem, you may also have a more detailed type of transabdominal ultrasound (advanced ultrasound). What are the risks? Generally, this is a safe test. There are no known risks for you or your baby from a prenatal ultrasound. What happens before the test?  Before a transabdominal ultrasound, you may be asked to drink fluid 2 hours before the exam and avoid emptying your bladder. A full bladder helps the images show up more clearly.  Before a transvaginal ultrasound, you may be asked to empty your bladder before the exam.  Wear loose, comfortable clothing so it is easy to undress or expose your lower belly for the exam. What happens during the test? If you are having a transabdominal ultrasound:  You will lie on an exam table.  Your belly will be exposed.  Gel will be rubbed over your belly.  The transducer will be pressed on your belly and moved back and forth, through the gel. You may feel slight pressure, but there should not be any pain.  You may be asked to change your position.  You may hear sounds of blood flow and your baby's heartbeat. You may be able to see images of your baby on the computer screen. Your health care provider may measure your baby's head and other body parts, looking for normal development.  After the exam, the gel will be cleaned off, and you can replace your clothing. You will be   able to empty your bladder after the exam is done. If you are having a transvaginal ultrasound:  You will change into a hospital gown or undress from the waist down and cover yourself with a paper sheet.  You will lie down on an exam table with your feet in footrests (stirrups).  The transducer will be covered with a protective cover and lubricated.  The transducer will be inserted into your vagina.  You may  hear sounds of blood flow and your baby's heartbeat. You may be able to see images of your baby on the computer screen.  After the exam, the transducer will be removed, and you can put your clothes back on. What can I expect after the test?  You can drive yourself home and return to all your normal activities.  A health care provider trained in interpreting ultrasounds will review the images taken during your exam and send a report to your health care provider.  It is up to you to get your test results. Ask your health care provider, or the department that is doing the test, when your results will be ready. Questions to ask your health care provider  Why am I having this prenatal ultrasound?  What information will this exam provide?  How much does this exam cost? What costs will my insurance cover?  Can my partner or support person be with me during the exam?  When can I expect to get the results? Summary  A prenatal ultrasound is a safe and painless imaging exam that gives information about your pregnancy and your developing baby.  Transvaginal ultrasound exams are often done in early pregnancy. Standard transabdominal ultrasounds are typically done between 18 and 22 weeks of pregnancy. You may have other prenatal ultrasounds as needed.  This exam has no risks for you or your baby. After the exam, you can go home and return to all your usual activities. This information is not intended to replace advice given to you by your health care provider. Make sure you discuss any questions you have with your health care provider. Document Revised: 11/02/2018 Document Reviewed: 09/13/2017 Elsevier Patient Education  2020 [employer] Inc[employer]  

## 2020-06-10 ENCOUNTER — Telehealth: Payer: Self-pay

## 2020-06-10 NOTE — Telephone Encounter (Signed)
Pt calling; 23wks; worked a long 12hr shift last night; has to wear steel toed boots; when she tool them off she noticed her feel felt a little swollen; when she took off her socks her toes were white and the bottom of her feet were purple; she took a warm bath and elevated feet over night; toes only were a little bit swollen today.  Should she work, take the day off and rest, work 8hr and repeat process of warm bath and elevation?  [id number]  Pt states she has called out of work today.  Adv Steel toed boots should be supportive so that's good; adv to drink 64oz water a day, elevate feet as much as possible, watch salt intake.  Discuss c provider at next visit if willing to write note for her to work 8h shifts instead of 12.

## 2020-06-24 ENCOUNTER — Ambulatory Visit (INDEPENDENT_AMBULATORY_CARE_PROVIDER_SITE_OTHER): Payer: Medicaid Other | Admitting: Obstetrics & Gynecology

## 2020-06-24 ENCOUNTER — Encounter: Payer: Self-pay | Admitting: Obstetrics & Gynecology

## 2020-06-24 ENCOUNTER — Other Ambulatory Visit: Payer: Self-pay

## 2020-06-24 VITALS — BP 120/70 | Wt 217.0 lb

## 2020-06-24 DIAGNOSIS — Z3A24 24 weeks gestation of pregnancy: Secondary | ICD-10-CM

## 2020-06-24 DIAGNOSIS — Z3402 Encounter for supervision of normal first pregnancy, second trimester: Secondary | ICD-10-CM

## 2020-06-24 NOTE — Patient Instructions (Signed)

## 2020-06-24 NOTE — Progress Notes (Signed)
  Subjective  Fetal Movement? yes Contractions? no Leaking Fluid? no Vaginal Bleeding? no Edema esp after work (on feet) Objective  BP 120/70   Wt 217 lb (98.4 kg)   LMP 01/03/2020   BMI 38.44 kg/m  General: NAD Pumonary: no increased work of breathing Abdomen: gravid, non-tender Extremities: no edema Psychiatric: mood appropriate, affect full  Assessment  24 y.o. G1P0 at [redacted]w[redacted]d by  10/09/2020, by Last Menstrual Period presenting for routine prenatal visit  Plan   Problem List Items Addressed This Visit    [redacted] weeks gestation of pregnancy       Encounter for supervision of normal first pregnancy in second trimester        PNV, FMC Glucola nv  pregnancy 1 Problems (from 03/05/20 to present)    Problem Noted Resolved   Encounter for supervision of normal first pregnancy in first trimester 03/05/2020 by Natale Milch, MD No   Overview Addendum 05/27/2020 11:54 AM by Nadara Mustard, MD     Nursing Staff Provider  Office Location  [facility] Dating   EDD= 13 wk [place]  Language  English Anatomy US  [facility] nml  Flu Vaccine   Genetic Screen  decl   TDaP vaccine    Hgb A1C or  GTT Third trimester :   Rhogam   not needed   LAB RESULTS   Feeding Plan  Blood Type A/Positive/-- (08/12 1437)   Contraception  Antibody Negative (08/12 1437)  Circumcision  Rubella 2.77 (08/12 1437)  Pediatrician   RPR Non Reactive (08/12 1437)   Support Person  HBsAg Negative (08/12 1437)   Prenatal Classes  HIV Non Reactive (08/12 1437)    Varicella  non-immune  BTL Consent n/a GBS  (For PCN allergy, check sensitivities)        VBAC Consent n/a Pap   NIL    Hgb Electro      CF      SMA                 Annamarie Major, MD, [facility], [facility] 06/24/2020  10:04 AM

## 2020-07-22 ENCOUNTER — Other Ambulatory Visit: Payer: Medicaid Other

## 2020-07-22 ENCOUNTER — Ambulatory Visit (INDEPENDENT_AMBULATORY_CARE_PROVIDER_SITE_OTHER): Payer: Medicaid Other | Admitting: Obstetrics and Gynecology

## 2020-07-22 ENCOUNTER — Other Ambulatory Visit: Payer: Self-pay

## 2020-07-22 VITALS — BP 112/68 | Wt 220.0 lb

## 2020-07-22 DIAGNOSIS — Z3A28 28 weeks gestation of pregnancy: Secondary | ICD-10-CM

## 2020-07-22 DIAGNOSIS — Z3401 Encounter for supervision of normal first pregnancy, first trimester: Secondary | ICD-10-CM

## 2020-07-22 DIAGNOSIS — Z131 Encounter for screening for diabetes mellitus: Secondary | ICD-10-CM

## 2020-07-22 LAB — POCT URINALYSIS DIPSTICK OB
Glucose, UA: NEGATIVE
POC,PROTEIN,UA: NEGATIVE

## 2020-07-22 NOTE — Addendum Note (Signed)
Addended by: Fortunato Curling R on: 07/22/2020 11:02 AM   Modules accepted: Orders

## 2020-07-22 NOTE — Progress Notes (Signed)
Routine Prenatal Care Visit  Subjective  Karen Benton is a 24 y.o. G1P0 at [redacted]w[redacted]d being seen today for ongoing prenatal care.  She is currently monitored for the following issues for this low-risk pregnancy and has Encounter for supervision of normal first pregnancy in first trimester on their problem list.  ----------------------------------------------------------------------------------- Patient reports no complaints.   Contractions: Not present. Vag. Bleeding: None.  Movement: Present. Denies leaking of fluid.  ----------------------------------------------------------------------------------- The following portions of the patient's history were reviewed and updated as appropriate: allergies, current medications, past family history, past medical history, past social history, past surgical history and problem list. Problem list updated.   Objective  Blood pressure 112/68, weight 220 lb (99.8 kg), last menstrual period 01/03/2020. Pregravid weight 187 lb (84.8 kg) Total Weight Gain 33 lb (15 kg) Urinalysis:      Fetal Status: Fetal Heart Rate (bpm): 145 Fundal Height: 30 cm Movement: Present     General:  Alert, oriented and cooperative. Patient is in no acute distress.  Skin: Skin is warm and dry. No rash noted.   Cardiovascular: Normal heart rate noted  Respiratory: Normal respiratory effort, no problems with respiration noted  Abdomen: Soft, gravid, appropriate for gestational age. Pain/Pressure: Absent     Pelvic:  Cervical exam deferred        Extremities: Normal range of motion.     ental Status: Normal mood and affect. Normal behavior. Normal judgment and thought content.     Assessment   24 y.o. G1P0 at [redacted]w[redacted]d by  10/09/2020, by Last Menstrual Period presenting for routine prenatal visit  Plan   pregnancy 1 Problems (from 03/05/20 to present)    Problem Noted Resolved   Encounter for supervision of normal first pregnancy in first trimester 03/05/2020 by Natale Milch, MD No   Overview Addendum 05/27/2020 11:54 AM by Nadara Mustard, MD     Nursing Staff Provider  Office Location  [facility] Dating   EDD= 13 wk [place]  Language  English Anatomy US  [facility] nml  Flu Vaccine   Genetic Screen  decl   TDaP vaccine    Hgb A1C or  GTT Third trimester :   Rhogam   not needed   LAB RESULTS   Feeding Plan  Blood Type A/Positive/-- (08/12 1437)   Contraception  Antibody Negative (08/12 1437)  Circumcision  Rubella 2.77 (08/12 1437)  Pediatrician   RPR Non Reactive (08/12 1437)   Support Person  HBsAg Negative (08/12 1437)   Prenatal Classes  HIV Non Reactive (08/12 1437)    Varicella  non-immune  BTL Consent n/a GBS  (For PCN allergy, check sensitivities)        VBAC Consent n/a Pap   NIL    Hgb Electro      CF      SMA               Previous Version       Gestational age appropriate obstetric precautions including but not limited to vaginal bleeding, contractions, leaking of fluid and fetal movement were reviewed in detail with the patient.    1) 28 week lab obtained today  2) TDAP next visit  3) Marginal cord insertion - risk of adverse fetal outcomes low unlike velamentous cord insertions.  However some association with growth restriction in fetus so 36 week growth scan discussed   Return in about 2 weeks (around 08/05/2020) for ROB.  Vena Austria, MD, [facility], [facility] 07/22/2020, 9:50 AM

## 2020-07-23 LAB — 28 WEEK RH+PANEL
Basophils Absolute: 0 10*3/uL (ref 0.0–0.2)
Basos: 0 %
EOS (ABSOLUTE): 0.2 10*3/uL (ref 0.0–0.4)
Eos: 1 %
Gestational Diabetes Screen: 109 mg/dL (ref 65–139)
HIV Screen 4th Generation wRfx: NONREACTIVE
Hematocrit: 33.2 % — ABNORMAL LOW (ref 34.0–46.6)
Hemoglobin: 11.5 g/dL (ref 11.1–15.9)
Immature Grans (Abs): 0.3 10*3/uL — ABNORMAL HIGH (ref 0.0–0.1)
Immature Granulocytes: 2 %
Lymphocytes Absolute: 2.1 10*3/uL (ref 0.7–3.1)
Lymphs: 17 %
MCH: 30.3 pg (ref 26.6–33.0)
MCHC: 34.6 g/dL (ref 31.5–35.7)
MCV: 88 fL (ref 79–97)
Monocytes Absolute: 0.9 10*3/uL (ref 0.1–0.9)
Monocytes: 7 %
Neutrophils Absolute: 8.6 10*3/uL — ABNORMAL HIGH (ref 1.4–7.0)
Neutrophils: 73 %
Platelets: 251 10*3/uL (ref 150–450)
RBC: 3.79 x10E6/uL (ref 3.77–5.28)
RDW: 11.9 % (ref 11.7–15.4)
RPR Ser Ql: NONREACTIVE
WBC: 12 10*3/uL — ABNORMAL HIGH (ref 3.4–10.8)

## 2020-08-05 ENCOUNTER — Ambulatory Visit (INDEPENDENT_AMBULATORY_CARE_PROVIDER_SITE_OTHER): Payer: Medicaid Other | Admitting: Obstetrics & Gynecology

## 2020-08-05 ENCOUNTER — Other Ambulatory Visit: Payer: Self-pay

## 2020-08-05 ENCOUNTER — Encounter: Payer: Self-pay | Admitting: Obstetrics & Gynecology

## 2020-08-05 VITALS — BP 100/60 | Wt 227.0 lb

## 2020-08-05 DIAGNOSIS — Z3403 Encounter for supervision of normal first pregnancy, third trimester: Secondary | ICD-10-CM

## 2020-08-05 DIAGNOSIS — Z3A30 30 weeks gestation of pregnancy: Secondary | ICD-10-CM | POA: Diagnosis not present

## 2020-08-05 DIAGNOSIS — Z23 Encounter for immunization: Secondary | ICD-10-CM | POA: Diagnosis not present

## 2020-08-05 DIAGNOSIS — O43199 Other malformation of placenta, unspecified trimester: Secondary | ICD-10-CM

## 2020-08-05 LAB — POCT URINALYSIS DIPSTICK OB
Glucose, UA: NEGATIVE
POC,PROTEIN,UA: NEGATIVE

## 2020-08-05 NOTE — Patient Instructions (Signed)

## 2020-08-05 NOTE — Addendum Note (Signed)
Addended by: Donnetta Hail on: 08/05/2020 11:40 AM   Modules accepted: Orders

## 2020-08-19 ENCOUNTER — Ambulatory Visit (INDEPENDENT_AMBULATORY_CARE_PROVIDER_SITE_OTHER): Payer: Medicaid Other | Admitting: Obstetrics

## 2020-08-19 ENCOUNTER — Other Ambulatory Visit: Payer: Self-pay

## 2020-08-19 VITALS — BP 100/70 | Wt 232.0 lb

## 2020-08-19 DIAGNOSIS — Z3A32 32 weeks gestation of pregnancy: Secondary | ICD-10-CM

## 2020-08-19 DIAGNOSIS — Z3403 Encounter for supervision of normal first pregnancy, third trimester: Secondary | ICD-10-CM

## 2020-08-19 LAB — POCT URINALYSIS DIPSTICK OB
Glucose, UA: NEGATIVE
POC,PROTEIN,UA: NEGATIVE

## 2020-08-19 NOTE — Addendum Note (Signed)
Addended by: Cornelius Moras D on: 08/19/2020 09:46 AM   Modules accepted: Orders

## 2020-08-19 NOTE — Progress Notes (Signed)
  Routine Prenatal Care Visit  Subjective  Karen Benton is a 25 y.o. G1P0 at [redacted]w[redacted]d being seen today for ongoing prenatal care.  She is currently monitored for the following issues for this high-risk pregnancy and has Encounter for supervision of normal first pregnancy in first trimester on their problem list.  ----------------------------------------------------------------------------------- Patient reports no complaints.   Contractions: Not present. Vag. Bleeding: None.  Movement: Present. Leaking Fluid denies.  ----------------------------------------------------------------------------------- The following portions of the patient's history were reviewed and updated as appropriate: allergies, current medications, past family history, past medical history, past social history, past surgical history and problem list. Problem list updated.  Objective  Blood pressure 100/70, weight 232 lb (105.2 kg), last menstrual period 01/03/2020. Pregravid weight 187 lb (84.8 kg) Total Weight Gain 45 lb (20.4 kg) Urinalysis: Urine Protein    Urine Glucose    Fetal Status:     Movement: Present     General:  Alert, oriented and cooperative. Patient is in no acute distress.  Skin: Skin is warm and dry. No rash noted.   Cardiovascular: Normal heart rate noted  Respiratory: Normal respiratory effort, no problems with respiration noted  Abdomen: Soft, gravid, appropriate for gestational age. Pain/Pressure: Present     Pelvic:  Cervical exam deferred        Extremities: Normal range of motion.     Mental Status: Normal mood and affect. Normal behavior. Normal judgment and thought content.   Assessment   25 y.o. G1P0 at [redacted]w[redacted]d by  10/09/2020, by Last Menstrual Period presenting for routine prenatal visit  Plan   pregnancy 1 Problems (from 03/05/20 to present)    Problem Noted Resolved   Encounter for supervision of normal first pregnancy in first trimester 03/05/2020 by Natale Milch, MD No    Overview Addendum 08/05/2020 11:34 AM by Nadara Mustard, MD     Nursing Staff Provider  Office Location  [facility] Dating   EDD= 13 wk [place]  Language  English Anatomy US  [facility] nml  Flu Vaccine  08/05/20 Genetic Screen  nml XY  TDaP vaccine   08/05/20 Hgb A1C or  GTT Third trimester : 109  Rhogam   not needed   LAB RESULTS   Feeding Plan Breast Blood Type A/Positive/-- (08/12 1437)   Contraception Condoms Antibody Negative (08/12 1437)  Circumcision  Rubella 2.77 (08/12 1437)  Pediatrician   RPR Non Reactive (12/29 1034)   Support Person  HBsAg Negative (08/12 1437)   Prenatal Classes  HIV Non Reactive (12/29 1034)    Varicella  non-immune  BTL Consent n/a GBS  (For PCN allergy, check sensitivities)        VBAC Consent n/a Pap   NIL    Hgb Electro  n/a    CF Not done     SMA Not done              Previous Version       Preterm labor symptoms and general obstetric precautions including but not limited to vaginal bleeding, contractions, leaking of fluid and fetal movement were reviewed in detail with the patient. Please refer to After Visit Summary for other counseling recommendations.   Return in about 2 weeks (around 09/02/2020) for return OB.  Mirna Mires, CNM  08/19/2020 9:28 AM

## 2020-09-02 ENCOUNTER — Encounter: Payer: Medicaid Other | Admitting: Obstetrics & Gynecology

## 2020-09-09 ENCOUNTER — Ambulatory Visit: Payer: Medicaid Other

## 2020-09-09 ENCOUNTER — Other Ambulatory Visit: Payer: Self-pay

## 2020-09-09 ENCOUNTER — Ambulatory Visit (INDEPENDENT_AMBULATORY_CARE_PROVIDER_SITE_OTHER): Payer: Medicaid Other | Admitting: Obstetrics and Gynecology

## 2020-09-09 ENCOUNTER — Encounter: Payer: Self-pay | Admitting: Obstetrics and Gynecology

## 2020-09-09 VITALS — BP 118/72 | Ht 63.0 in | Wt 236.8 lb

## 2020-09-09 DIAGNOSIS — Z3403 Encounter for supervision of normal first pregnancy, third trimester: Secondary | ICD-10-CM

## 2020-09-09 DIAGNOSIS — Z3A35 35 weeks gestation of pregnancy: Secondary | ICD-10-CM

## 2020-09-09 DIAGNOSIS — O43199 Other malformation of placenta, unspecified trimester: Secondary | ICD-10-CM

## 2020-09-09 DIAGNOSIS — O43193 Other malformation of placenta, third trimester: Secondary | ICD-10-CM | POA: Insufficient documentation

## 2020-09-09 LAB — POCT URINALYSIS DIPSTICK OB
Glucose, UA: NEGATIVE
POC,PROTEIN,UA: NEGATIVE

## 2020-09-09 NOTE — Addendum Note (Signed)
Addended by: Clement Husbands A on: 09/09/2020 03:30 PM   Modules accepted: Orders

## 2020-09-09 NOTE — Progress Notes (Signed)
Routine Prenatal Care Visit  Subjective  Karen Benton is a 25 y.o. G1P0 at [redacted]w[redacted]d being seen today for ongoing prenatal care.  She is currently monitored for the following issues for this low-risk pregnancy and has Encounter for supervision of normal first pregnancy in first trimester and Marginal insertion of umbilical cord affecting management of mother in third trimester on their problem list.  ----------------------------------------------------------------------------------- Patient reports no complaints today, although states she has had some episodes of dizziness and lightheadedness at work, typically associated with long periods of time on her feet and skipping breaks. Patient encouraged to stay hydrated, take breaks when able, and eat protein-based snacks. Contractions: Not present. Vag. Bleeding: None.  Movement: Present. Denies leaking of fluid.  ----------------------------------------------------------------------------------- The following portions of the patient's history were reviewed and updated as appropriate: allergies, current medications, past family history, past medical history, past social history, past surgical history and problem list. Problem list updated.   Objective  Blood pressure 118/72, height 5\' 3"  (1.6 m), weight 236 lb 12.8 oz (107.4 kg), last menstrual period 01/03/2020. Pregravid weight 187 lb (84.8 kg) Total Weight Gain 49 lb 12.8 oz (22.6 kg) Urinalysis:       Fetal Status:   Fundal Height: 37 cm Movement: Present    NONSTRESS TEST INTERPRETATION  INDICATIONS: patient reassurance FHR baseline: 125 RESULTS:  A NST procedure was performed with FHR monitoring and a normal baseline established, appropriate time of 20-40 minutes of evaluation, and accels >2 seen w 15x15 characteristics.  Results show a REACTIVE NST.    General:  Alert, oriented and cooperative. Patient is in no acute distress.  Skin: Skin is warm and dry. No rash noted.    Cardiovascular: Normal heart rate noted  Respiratory: Normal respiratory effort, no problems with respiration noted  Abdomen: Soft, gravid, appropriate for gestational age. Pain/Pressure: Absent     Pelvic:  Cervical exam deferred        Extremities: Normal range of motion.     ental Status: Normal mood and affect. Normal behavior. Normal judgment and thought content.     Assessment   25 y.o. G1P0 at [redacted]w[redacted]d by  10/09/2020, by Last Menstrual Period presenting for routine prenatal visit  Plan   pregnancy 1 Problems (from 03/05/20 to present)    Problem Noted Resolved   Marginal insertion of umbilical cord affecting management of mother in third trimester 09/09/2020 by 09/11/2020, CNM No   Encounter for supervision of normal first pregnancy in first trimester 03/05/2020 by 05/05/2020, MD No   Overview Addendum 08/05/2020 11:34 AM by 10/03/2020, MD     Nursing Staff Provider  Office Location  [facility] Dating   EDD= 13 wk Nadara Mustard  Language  English Anatomy US  [facility] nml  Flu Vaccine  08/05/20 Genetic Screen  nml XY  TDaP vaccine   08/05/20 Hgb A1C or  GTT Third trimester : 109  Rhogam   not needed   LAB RESULTS   Feeding Plan Breast Blood Type A/Positive/-- (08/12 1437)   Contraception Condoms Antibody Negative (08/12 1437)  Circumcision  Rubella 2.77 (08/12 1437)  Pediatrician   RPR Non Reactive (12/29 1034)   Support Person  HBsAg Negative (08/12 1437)   Prenatal Classes  HIV Non Reactive (12/29 1034)    Varicella  non-immune  BTL Consent n/a GBS  (For PCN allergy, check sensitivities)        VBAC Consent n/a Pap   NIL    Hgb  Electro  n/a    CF Not done     SMA Not done              Previous Version      -Unable to complete growth [place] today - will schedule for next visit -Discussed pain management in labor, information provided in AVS -NST reactive  Preterm labor precautions including but not limited to vaginal bleeding, contractions, leaking of fluid and  fetal movement were reviewed in detail with the patient.    Return in about 1 week (around 09/16/2020) for ROB with growth [place].  Zipporah Plants, CNM, MSN [facility], [facility] 09/09/2020, 12:05 PM

## 2020-09-09 NOTE — Patient Instructions (Signed)
How bad does labor hurt? Labor and birth are different for each person. The amount of pain and where you feel pain the most changes during labor. Even if you have had a baby before, labor pain can be different with each baby. Nobody can know ahead of time how difficult or painful their labor will be.  Why does labor hurt? During labor, your uterus (womb) pushes your baby down and stretches your cervix (the opening of your uterus). When your uterus has a contraction (muscles get tight), you feel pain that is like a strong cramp in your abdomen or lower back. Labor pain usually feels like the cramps you feel during your period. As labor goes on and the cervix gets more stretched, the cramping pain usually gets worse. Most contractions last 30 to 60 seconds, and you will be able to rest in between each one.  How can I decide before labor starts what will be the best way for me to cope with labor pain? Take childbirth classes. The more you know, the less you fear. Being afraid and worried makes pain hurt more. Knowing what to expect will help you cope better with labor pain. Learn about the different medication and non-medication options for coping with pain that are used where you are going to give birth.  What can I do to prepare for labor? Stay physically active during pregnancy. You will have more strength to get through labor. Also, women who are in good physical shape often have shorter labors. Find a support person or doula to be with you during labor. Having a person whose only job is to be with you and support you during labor will help you cope better. Talk with your support persons and caregivers about your ideas for what will help you cope with labor pain so that you can trust them to help you make decisions that are right for you when you are in labor.  What can I do to cope during early labor? Stay up and out of bed. Walking and being on your feet can help your contractions work better but  feel less painful. Do something you enjoy. This will help you keep your mind off the pain. Drink lots of fluids so you don't get dehydrated. Eat lightly if you are hungry. Take a warm shower or bath. Water often makes your contractions easier to handle.  What can I do to cope during active labor? Women who cope well during labor rock or move and sometimes make a noise like groaning during contractions. Then they rest between contractions. Going back and forth between moving and resting in a regular rhythm helps them cope with labor as it progresses. Each person has their own rhythm that works. Here are some ideas to help you find your rhythm during labor:  Between contractions Rest by being still or by rocking gently. Focus on your breathing in and breathing out. When you pay close attention to your breathing, your body can relax. Relax your muscles, especially your shoulders and toes. Those muscles often get tight during contractions. Move or rock your hips to relax those muscles. Change positions often. Listen to music that soothes you. This may help you relax and keep your mind off the pain. During contractions Get in a tub or shower. Water can help labor feel less painful. Make noise. You can moan, hum, or repeat comforting words over and over as you go through each contraction. Some women find different breathing patterns taught in childbirth   classes helpful. Move back and forth or rock your hips in whatever way helps you cope as the contraction gets more painful and then gets less painful as it goes away.  What can my support person do to help me cope with labor pain? Help you find your rhythm and then help you keep making the same noise and movements during a contraction and doing the same relaxing things between contractions. Hold your hand quietly. Touch or rub your back, hands, or feet if being touched is something you like. Offer you juice, water, or ice chips between  contractions. Help you change positions and support your body. Make the room you are in comforting. Keep the lights low, play soft music, and have something to look at that relaxes you or makes you happy. Put a cold washcloth on your forehead or neck. Put a heating pad or warm washcloth on your lower back. Talk to you and remind you that you are strong.  What can my health care provider do to help me cope with labor pain? Answer your questions. Check your progress and give you information about how the labor is going. Provide information about different ways to help you cope with labor pain.   For More Information Childbirth Connection: Comfort in Labor https://www.nationalpartnership.org/our-work/resources/health-care/maternity/comfort-in-labor-simkin.pdf  Childbirth Connection: Labor Pain http://www.childbirthconnection.org/giving-birth/labor-pain/  [facility]: Coping with Labor Pain without Using Medicines https://my.[facility].org/health/articles/15586-labor-without-medication-coping-skills  [facility]: Choosing How You Want to Cope with Labor Pain https://www.[facility].org/healthy-lifestyle/labor-and-delivery/in-depth/labor-pain/art-20044845  [facility]: Medicines for Labor Pain https://www.[facility].org/healthy-lifestyle/labor-and-delivery/in-depth/labor-and-delivery/art-20049326  Penny Simkin: Relaxation, Rhythm, and Ritual: The 3 Rs of Childbirth https://www.pennysimkin.com/three-rs/  

## 2020-09-16 ENCOUNTER — Other Ambulatory Visit (HOSPITAL_COMMUNITY)
Admission: RE | Admit: 2020-09-16 | Discharge: 2020-09-16 | Disposition: A | Discharge status: Home / Self Care | Payer: Medicaid Other | Source: Ambulatory Visit | Attending: Obstetrics and Gynecology | Admitting: Obstetrics and Gynecology

## 2020-09-16 ENCOUNTER — Other Ambulatory Visit: Payer: Self-pay

## 2020-09-16 ENCOUNTER — Ambulatory Visit (INDEPENDENT_AMBULATORY_CARE_PROVIDER_SITE_OTHER): Payer: Medicaid Other | Admitting: Obstetrics and Gynecology

## 2020-09-16 ENCOUNTER — Ambulatory Visit (INDEPENDENT_AMBULATORY_CARE_PROVIDER_SITE_OTHER): Payer: Medicaid Other

## 2020-09-16 VITALS — BP 106/58 | Wt 235.0 lb

## 2020-09-16 DIAGNOSIS — Z3A36 36 weeks gestation of pregnancy: Secondary | ICD-10-CM

## 2020-09-16 DIAGNOSIS — O43193 Other malformation of placenta, third trimester: Secondary | ICD-10-CM

## 2020-09-16 DIAGNOSIS — Z113 Encounter for screening for infections with a predominantly sexual mode of transmission: Secondary | ICD-10-CM | POA: Insufficient documentation

## 2020-09-16 DIAGNOSIS — Z3685 Encounter for antenatal screening for Streptococcus B: Secondary | ICD-10-CM

## 2020-09-16 DIAGNOSIS — Z3403 Encounter for supervision of normal first pregnancy, third trimester: Secondary | ICD-10-CM

## 2020-09-16 NOTE — Progress Notes (Signed)
Routine Prenatal Care Visit  Subjective  Karen Benton is a 25 y.o. G1P0 at [redacted]w[redacted]d being seen today for ongoing prenatal care.  She is currently monitored for the following issues for this low-risk pregnancy and has Encounter for supervision of normal first pregnancy in third trimester and Marginal insertion of umbilical cord affecting management of mother in third trimester on their problem list.  ----------------------------------------------------------------------------------- Patient reports period of lightheadedness/feeling faint during [place] today. Patient states she had not eaten for an extended period of time and felt better following a snack and water. Patient states she has experienced episodes like this about once per week during the last several weeks - usually associated with a prolonged period of not eating. Patient reports intermittent back pain during the last several nights. Denies S&S of burning with voiding or urgency to void..   Contractions: Irregular. Vag. Bleeding: None.  Movement: Present. Denies leaking of fluid.  ----------------------------------------------------------------------------------- The following portions of the patient's history were reviewed and updated as appropriate: allergies, current medications, past family history, past medical history, past social history, past surgical history and problem list. Problem list updated.   Objective  Blood pressure (!) 106/58, weight 235 lb (106.6 kg), last menstrual period 01/03/2020. HR: 87 bmp, SpO2: 98% Pregravid weight 187 lb (84.8 kg) Total Weight Gain 48 lb (21.8 kg) Urinalysis:      Fetal Status: Fetal Heart Rate (bpm): 135 Fundal Height: 37 cm Movement: Present  Presentation: Vertex  General:  Alert, oriented and cooperative. Patient is in no acute distress.  Skin: Skin is warm and dry. No rash noted.   Cardiovascular: Normal heart rate noted  Respiratory: Normal respiratory effort, no problems with  respiration noted  Abdomen: Soft, gravid, appropriate for gestational age. Pain/Pressure: Present     Pelvic:  Cervical exam performed Dilation: 1 Effacement (%): 50 Station: Ballotable  Extremities: Normal range of motion.  Edema: Trace  ental Status: Normal mood and affect. Normal behavior. Normal judgment and thought content.     Assessment   25 y.o. G1P0 at [redacted]w[redacted]d by  10/09/2020, by Last Menstrual Period presenting for routine prenatal visit  Plan   pregnancy 1 Problems (from 03/05/20 to present)    Problem Noted Resolved   Marginal insertion of umbilical cord affecting management of mother in third trimester 09/09/2020 by Zipporah Plants, CNM No   Encounter for supervision of normal first pregnancy in third trimester 03/05/2020 by Natale Milch, MD No   Overview Addendum 09/16/2020  3:36 PM by Zipporah Plants, CNM     Nursing Staff Provider  Office Location  [facility] Dating   EDD= 13 wk [place]  Language  English Anatomy US  WSOB nml  Flu Vaccine  08/05/20 Genetic Screen  nml XY  TDaP vaccine   08/05/20 Hgb A1C or  GTT Third trimester : 109  Rhogam   not needed   LAB RESULTS   Feeding Plan Breast Blood Type A/Positive/-- (08/12 1437)   Contraception Condoms Antibody Negative (08/12 1437)  Circumcision  Rubella 2.77 (08/12 1437)  Pediatrician   RPR Non Reactive (12/29 1034)   Support Person  Thayer Ohm HBsAg Negative (08/12 1437)   Prenatal Classes  No- patient states she has completed a lot of reading and research HIV Non Reactive (12/29 1034)    Varicella  non-immune  BTL Consent n/a GBS  (For PCN allergy, check sensitivities)        VBAC Consent n/a Pap   NIL    Hgb Electro  n/a    CF Not done     SMA Not done              Previous Version      -Reviewed growth [place] and discussed marginal cord insertion -Reviewed back pain in pregnancy, recommended heat, extra strength acetaminophen, warm showers/bath, discussed positioning during sleep -GBS/aptima collected today  Preterm  labor precautions including but not limited to vaginal bleeding, contractions, leaking of fluid and fetal movement were reviewed in detail with the patient.    Return in about 1 week (around 09/23/2020) for ROB (can schedule for weekly visits for next 2-3 weeks).  Zipporah Plants, CNM, MSN [facility], [facility] 09/16/2020, 3:36 PM

## 2020-09-17 LAB — CERVICOVAGINAL ANCILLARY ONLY
Chlamydia: NEGATIVE
Comment: NEGATIVE
Comment: NORMAL
Neisseria Gonorrhea: NEGATIVE

## 2020-09-18 LAB — STREP GP B NAA: Strep Gp B NAA: NEGATIVE

## 2020-09-22 ENCOUNTER — Encounter: Payer: Self-pay | Admitting: Obstetrics and Gynecology

## 2020-09-23 ENCOUNTER — Encounter: Payer: Self-pay | Admitting: Obstetrics

## 2020-09-23 ENCOUNTER — Other Ambulatory Visit: Payer: Self-pay

## 2020-09-23 ENCOUNTER — Ambulatory Visit (INDEPENDENT_AMBULATORY_CARE_PROVIDER_SITE_OTHER): Payer: Medicaid Other | Admitting: Obstetrics

## 2020-09-23 VITALS — BP 106/70 | Wt 240.0 lb

## 2020-09-23 DIAGNOSIS — Z3A37 37 weeks gestation of pregnancy: Secondary | ICD-10-CM

## 2020-09-23 DIAGNOSIS — Z3403 Encounter for supervision of normal first pregnancy, third trimester: Secondary | ICD-10-CM

## 2020-09-23 LAB — POCT URINALYSIS DIPSTICK OB
Glucose, UA: NEGATIVE
POC,PROTEIN,UA: NEGATIVE

## 2020-09-23 NOTE — Progress Notes (Signed)
Routine Prenatal Care Visit  Subjective  Karen Benton is a 25 y.o. G1P0000 at [redacted]w[redacted]d being seen today for ongoing prenatal care.  She is currently monitored for the following issues for this high-risk pregnancy and has Encounter for supervision of normal first pregnancy in third trimester and Marginal insertion of umbilical cord affecting management of mother in third trimester on their problem list.  ----------------------------------------------------------------------------------- Patient reports no bleeding, no contractions and no leaking.  She has noticed a few occasions when she felt light headed. Not associated with meals or changing position. Contractions: Irregular. Vag. Bleeding: None.  Movement: Present. Leaking Fluid denies.  ----------------------------------------------------------------------------------- The following portions of the patient's history were reviewed and updated as appropriate: allergies, current medications, past family history, past medical history, past social history, past surgical history and problem list. Problem list updated.  Objective  Blood pressure 106/70, weight 240 lb (108.9 kg), last menstrual period 01/03/2020. Pregravid weight 187 lb (84.8 kg) Total Weight Gain 53 lb (24 kg) Urinalysis: Urine Protein    Urine Glucose    Fetal Status:     Movement: Present     General:  Alert, oriented and cooperative. Patient is in no acute distress.  Skin: Skin is warm and dry. No rash noted.   Cardiovascular: Normal heart rate noted  Respiratory: Normal respiratory effort, no problems with respiration noted  Abdomen: Soft, gravid, appropriate for gestational age. Pain/Pressure: Absent     Pelvic:  Cervical exam deferred        Extremities: Normal range of motion.     Mental Status: Normal mood and affect. Normal behavior. Normal judgment and thought content.   Assessment   25 y.o. G1P0000 at [redacted]w[redacted]d by  10/09/2020, by Last Menstrual Period presenting for  routine prenatal visit  Plan   pregnancy 1 Problems (from 03/05/20 to present)    Problem Noted Resolved   Marginal insertion of umbilical cord affecting management of mother in third trimester 09/09/2020 by Zipporah Plants, CNM No   Encounter for supervision of normal first pregnancy in third trimester 03/05/2020 by Natale Milch, MD No   Overview Addendum 09/23/2020 10:53 AM by Mirna Mires, CNM     Nursing Staff Provider  Office Location  [facility] Dating   EDD= 13 wk [place]  Language  English Anatomy US  WSOB nml  Flu Vaccine  08/05/20 Genetic Screen  nml XY  TDaP vaccine   08/05/20 Hgb A1C or  GTT Third trimester : 109  Rhogam   not needed   LAB RESULTS   Feeding Plan Breast Blood Type A/Positive/-- (08/12 1437)   Contraception Condoms Antibody Negative (08/12 1437)  Circumcision  Rubella 2.77 (08/12 1437)  Pediatrician   RPR Non Reactive (12/29 1034)   Support Person  Thayer Ohm HBsAg Negative (08/12 1437)   Prenatal Classes  No- patient states she has completed a lot of reading and research HIV Non Reactive (12/29 1034)    Varicella  non-immune  BTL Consent n/a GBS  (For PCN allergy, check sensitivities) negative       VBAC Consent n/a Pap   NIL    Hgb Electro  n/a    CF Not done     SMA Not done              Previous Version       Term labor symptoms and general obstetric precautions including but not limited to vaginal bleeding, contractions, leaking of fluid and fetal movement were reviewed in detail with the  patient. Please refer to After Visit Summary for other counseling recommendations.  Encouraged to snack every few hours.Monitor the episodes of light headedness- if they continue advised to make an office appointment.  Return in about 1 week (around 09/30/2020) for return OB.  Mirna Mires, CNM  09/23/2020 11:25 AM

## 2020-09-30 ENCOUNTER — Other Ambulatory Visit: Payer: Self-pay

## 2020-09-30 ENCOUNTER — Ambulatory Visit (INDEPENDENT_AMBULATORY_CARE_PROVIDER_SITE_OTHER): Payer: Medicaid Other | Admitting: Obstetrics & Gynecology

## 2020-09-30 ENCOUNTER — Encounter: Payer: Self-pay | Admitting: Obstetrics & Gynecology

## 2020-09-30 VITALS — BP 100/70 | Wt 245.0 lb

## 2020-09-30 DIAGNOSIS — Z3403 Encounter for supervision of normal first pregnancy, third trimester: Secondary | ICD-10-CM

## 2020-09-30 DIAGNOSIS — Z3A38 38 weeks gestation of pregnancy: Secondary | ICD-10-CM

## 2020-09-30 NOTE — Progress Notes (Signed)
  Subjective  Fetal Movement? yes Contractions? no Leaking Fluid? no Vaginal Bleeding? no  Objective  BP 100/70   Wt 245 lb (111.1 kg)   LMP 01/03/2020   BMI 43.40 kg/m  General: NAD Pumonary: no increased work of breathing Abdomen: gravid, non-tender Extremities: no edema Psychiatric: mood appropriate, affect full  Assessment  25 y.o. G1P0000 at [redacted]w[redacted]d by  10/09/2020, by Last Menstrual Period presenting for routine prenatal visit  Plan   Problem List Items Addressed This Visit      Other   Encounter for supervision of normal first pregnancy in third trimester    Other Visit Diagnoses    [redacted] weeks gestation of pregnancy    -  Primary      pregnancy 1 Problems (from 03/05/20 to present)    Problem Noted Resolved   Marginal insertion of umbilical cord affecting management of mother in third trimester 09/09/2020 by Zipporah Plants, CNM No   Encounter for supervision of normal first pregnancy in third trimester 03/05/2020 by Natale Milch, MD No   Overview Addendum 09/23/2020 10:53 AM by Mirna Mires, CNM     Nursing Staff Provider  Office Location  [facility] Dating   EDD= 13 wk [place]  Language  English Anatomy US  WSOB nml  Flu Vaccine  08/05/20 Genetic Screen  nml XY  TDaP vaccine   08/05/20 Hgb A1C or  GTT Third trimester : 109  Rhogam   not needed   LAB RESULTS   Feeding Plan Breast Blood Type A/Positive/-- (08/12 1437)   Contraception Condoms Antibody Negative (08/12 1437)  Circumcision  Rubella 2.77 (08/12 1437)  Pediatrician   RPR Non Reactive (12/29 1034)   Support Person  Thayer Ohm HBsAg Negative (08/12 1437)   Prenatal Classes  No- patient states she has completed a lot of reading and research HIV Non Reactive (12/29 1034)    Varicella  non-immune  BTL Consent n/a GBS  (For PCN allergy, check sensitivities) negative       VBAC Consent n/a Pap   NIL    Hgb Electro  n/a    CF Not done     SMA Not done              Previous Version     PNV, FMC, Labor  precautions  Work note, out for rest of pregnancy  Annamarie Major, MD, [facility], [facility] 09/30/2020  10:13 AM

## 2020-09-30 NOTE — Patient Instructions (Signed)
Thank you for choosing [facility]. As part of our ongoing efforts to improve patient experience, we would appreciate your feedback. Please fill out the short survey that you will receive by mail or MyChart. Your opinion is important to [place]! -Dr Tiburcio Pea  First Stage of Labor Labor is your body's natural process of moving your baby and other structures, including the placenta and umbilical cord, out of your uterus. There are three stages of labor. How long each stage lasts is different for every woman. But certain events happen during each stage that are the same for everyone.  The first stage starts when true labor begins. This stage ends when your cervix, which is the opening from your uterus into your vagina, is completely open (dilated).  The second stage begins when your cervix is fully dilated and you start pushing. This stage ends when your baby is born.  The third stage is the delivery of the organ that nourished your baby during pregnancy (placenta). First stage of labor As your due date gets closer, you may start to notice certain physical changes that mean labor is going to start soon. You may feel that your baby has dropped lower into your pelvis. You may experience irregular, often painless, contractions that go away when you walk around or lie down ([employer] contractions). This is also called false labor. The first stage of labor begins when you start having contractions that come at regular (evenly spaced) intervals and your cervix starts to get thinner and wider in preparation for your baby to pass through. Birth care providers measure the dilation of your cervix in centimeters (cm). One centimeter is a little less than one-half of an inch. The first stage ends when your cervix is dilated to 10 cm. The first stage of labor is divided into three phases:  Early phase.  Active phase.  Transitional phase. The length of the first stage of labor varies. It may be longer if this is  your first pregnancy. You may spend most of this stage at home trying to relax and stay comfortable. How does this affect me? During the first stage of labor, you will move through three phases. What happens in the early phase?  You will start to have regular contractions that last 30-60 seconds. Contractions may come every 5-20 minutes. Keep track of your contractions and call your birth care provider.  Your water may break during this phase.  You may notice a clear or slightly bloody discharge of mucus (mucus plug) from your vagina.  Your cervix will dilate to 3-6 cm. What happens in the active phase? The active phase usually lasts 3-5 hours. You may go to the hospital or birth center around this time. During the active phase:  Your contractions will become stronger, longer, and more uncomfortable.  Your contractions may last 45-90 seconds and come every 3-5 minutes.  You may feel lower back pain.  Your birth care providers may examine your cervix and feel your belly to find the position of your baby.  You may have a monitor strapped to your belly to measure your contractions and your baby's heart rate.  You may start using your pain management options.  Your cervix may be dilated to 6 cm and may start to dilate more quickly. What happens in the transitional phase? The transitional phase typically lasts from 30 minutes to 2 hours. At the end of this phase, your cervix will be fully dilated to 10 cm. During the transitional phase:  Contractions will get stronger and longer.  Contractions may last 60-90 seconds and come less than 2 minutes apart.  You may feel hot flashes, chills, or nausea. How does this affect my baby? During the first stage of labor, your baby will gradually move down into your birth canal. Follow these instructions at home and in the hospital or birth center:  When labor first begins, try to stay calm. You are still in the early phase. If it is night, try  to get some sleep. If it is day, try to relax and save your energy. You may want to make some calls and get ready to go to the hospital or birth center.  When you are in the early phase, try these methods to help ease discomfort: ? Deep breathing and muscle relaxation. ? Taking a walk. ? Taking a warm bath or shower.  Drink some fluids and have a light snack if you feel like it.  Keep track of your contractions.  Based on the plan you created with your birth care provider, call when your contractions indicate it is time.  If your water breaks, note the time, color, and odor of the fluid.  When you are in the active phase, do your breathing exercises and rely on your support people and your team of birth care providers.   Contact a health care provider if:  Your contractions are strong and regular.  You have lower back pain or cramping.  Your water breaks.  You lose your mucus plug. Get help right away if you:  Have a severe headache that does not go away.  Have changes in your vision.  Have severe pain in your upper belly.  Do not feel the baby move.  Have bright red bleeding. Summary  The first stage of labor starts when true labor begins, and it ends when your cervix is dilated to 10 cm.  The first stage of labor has three phases: early, active, and transitional.  Your baby moves into the birth canal during the first stage of labor.  You may have contractions that become stronger and longer. You may also lose your mucus plug and have your water break.  Call your birth care provider when your contractions are frequent and strong enough to go to the hospital or birth center. This information is not intended to replace advice given to you by your health care provider. Make sure you discuss any questions you have with your health care provider. Document Revised: 11/01/2018 Document Reviewed: 09/24/2017 Elsevier Patient Education  2021 [employer].

## 2020-10-05 ENCOUNTER — Encounter: Payer: Self-pay | Admitting: Obstetrics & Gynecology

## 2020-10-05 ENCOUNTER — Ambulatory Visit (INDEPENDENT_AMBULATORY_CARE_PROVIDER_SITE_OTHER): Payer: Medicaid Other | Admitting: Obstetrics & Gynecology

## 2020-10-05 ENCOUNTER — Other Ambulatory Visit: Payer: Self-pay

## 2020-10-05 VITALS — BP 100/70 | Wt 247.0 lb

## 2020-10-05 DIAGNOSIS — Z3403 Encounter for supervision of normal first pregnancy, third trimester: Secondary | ICD-10-CM

## 2020-10-05 DIAGNOSIS — Z3A39 39 weeks gestation of pregnancy: Secondary | ICD-10-CM

## 2020-10-05 NOTE — Progress Notes (Signed)
  Subjective  Fetal Movement? yes Contractions? no Leaking Fluid? no Vaginal Bleeding? no  Objective  BP 100/70   Wt 247 lb (112 kg)   LMP 01/03/2020   BMI 43.75 kg/m  General: NAD Pumonary: no increased work of breathing Abdomen: gravid, non-tender Extremities: no edema Psychiatric: mood appropriate, affect full  Assessment  25 y.o. G1P0000 at [redacted]w[redacted]d by  10/09/2020, by Last Menstrual Period presenting for routine prenatal visit  Plan   Problem List Items Addressed This Visit      Other   Encounter for supervision of normal first pregnancy in third trimester    Other Visit Diagnoses    [redacted] weeks gestation of pregnancy    -  Primary      pregnancy 1 Problems (from 03/05/20 to present)    Problem Noted Resolved   Marginal insertion of umbilical cord affecting management of mother in third trimester 09/09/2020 by Zipporah Plants, CNM No   Encounter for supervision of normal first pregnancy in third trimester 03/05/2020 by Natale Milch, MD No   Overview Addendum 09/23/2020 10:53 AM by Mirna Mires, CNM     Nursing Staff Provider  Office Location  [facility] Dating   EDD= 13 wk [place]  Language  English Anatomy US  WSOB nml  Flu Vaccine  08/05/20 Genetic Screen  nml XY  TDaP vaccine   08/05/20 Hgb A1C or  GTT Third trimester : 109  Rhogam   not needed   LAB RESULTS   Feeding Plan Breast Blood Type A/Positive/-- (08/12 1437)   Contraception Condoms Antibody Negative (08/12 1437)  Circumcision  Rubella 2.77 (08/12 1437)  Pediatrician   RPR Non Reactive (12/29 1034)   Support Person  Thayer Ohm HBsAg Negative (08/12 1437)   Prenatal Classes  No- patient states she has completed a lot of reading and research HIV Non Reactive (12/29 1034)    Varicella  non-immune  BTL Consent n/a GBS  (For PCN allergy, check sensitivities) negative       VBAC Consent n/a Pap   NIL    Hgb Electro  n/a    CF Not done     SMA Not done            PNV, FMC, Labor precautions, Membranes  stripped, possible IOL next week discussed  Annamarie Major, MD, [facility], [facility] 10/05/2020  10:03 AM

## 2020-10-05 NOTE — Patient Instructions (Signed)
Pain Relief During Labor and Delivery Many things can cause pain during labor and delivery, including:  Pressure due to the baby moving through the pelvis.  Stretching of tissues due to the baby moving through the birth canal.  Muscle tension due to anxiety or nervousness.  The uterus tightening (contracting)and relaxing to help move the baby. How do I get pain relief during labor and delivery? Discuss your pain relief options with your health care provider during your prenatal visits. Explore the options offered by your hospital or birth center. There are many ways to deal with the pain of labor and delivery. You can try relaxation techniques or doing relaxing activities, taking a warm shower or bath (hydrotherapy), or other methods. There are also many medicines available to help control pain. Relaxation techniques and activities Practice relaxation techniques or do relaxing activities, such as:  Focused breathing.  Meditation.  Visualization.  Aroma therapy.  Listening to your favorite music.  Hypnosis. Hydrotherapy Take a warm shower or bath. This may:  Provide comfort and relaxation.  Lessen your feeling of pain.  Reduce the amount of pain medicine needed.  Shorten the length of labor. Other methods Try doing other things, such as:  Getting a massage or having counterpressure on your back.  Applying warm packs or ice packs.  Changing positions often, moving around, or using a birthing ball. Medicines You may be given:  Pain medicine through an IV or an injection into a muscle.  Pain medicine inserted into your spinal column.  Injections of sterile water just under the skin on your lower back.  Nitrous oxide inhalation therapy, also called laughing gas.   What kinds of medicine are available for pain relief? There are two kinds of medicines that can be used to relieve pain during labor and delivery:  Analgesics. These medicines decrease pain without  causing you to lose feeling or the ability to move your muscles.  Anesthetics. These medicines block feeling in the body and can decrease your ability to move freely. Both kinds of medicine can cause minor side effects, such as nausea, trouble concentrating, and sleepiness. They can also affect the baby's heart rate before birth and his or her breathing after birth. For this reason, health care providers are careful about when and how much medicine is given. Which medicines are used to provide pain relief? Common medicines The most common medicines used to help manage pain during labor and delivery include:  Opioids. Opioids are medicines that decrease how much pain you feel (perception of pain). These medicines can be given through an IV or may be used with anesthetics to block pain.  Epidural analgesia. ? Epidural analgesia is given through a very thin tube that is inserted into the lower back. Medicine is delivered continuously to the area near your spinal column nerves (epidural space). After having this treatment, you may be able to move your legs, but you will not be able to walk. Depending on the amount and type of medicine given, you may lose all feeling in the lower half of your body, or you may have some sensation, including the urge to push. This treatment can be used to give pain relief for a vaginal birth. ? Sometimes, a numbing medicine is injected into the spinal fluid when an epidural catheter is placed. This provides for immediate relief but only lasts for 1-2 hours. Once it wears off, the epidural will provide pain relief. This is called a combined spinal-epidural (CSE) block.  Intrathecal analgesia (  spinal analgesia). Intrathecal analgesia is similar to epidural analgesia, but the medicine is injected into the spinal fluid instead of the epidural space. It is usually only given once. It starts to relieve pain quickly, but the pain relief lasts only 1-2 hours.  Pudendal block. This  block is done by injecting numbing medicine through the wall of the vagina and into a nerve in the pelvis. Other medicines Other medicines used to help manage pain during labor and delivery include:  Local anesthetics. These are used to numb a small area of the body. They may be used along with another kind of medicine or used to numb the nerves of the vagina, cervix, and perineum during the second stage of labor.  Spinal block (spinal anesthesia). Spinal anesthesia is similar to spinal analgesia, but the medicine that is used contains longer-acting numbing medicines and pain medicines. This type of anesthesia can be used for a cesarean delivery and allows you to stay awake for the birth of your baby.  General anesthetics cause you to lose consciousness so you do not feel pain. They are usually only used for an emergency cesarean delivery. These medicines are given through an IV or a mask or both. These medicines are used as part of a procedure or for an emergency delivery. Summary  Women have many options to help them manage the pain associated with labor and delivery.  You can try doing relaxing activities, taking a warm shower or bath, or other methods.  There are also many medicines available to help control pain during labor and delivery.  Talk with your health care provider about what options are available to you. This information is not intended to replace advice given to you by your health care provider. Make sure you discuss any questions you have with your health care provider. Document Revised: 05/29/2019 Document Reviewed: 05/29/2019 Elsevier Patient Education  2021 [employer].

## 2020-10-07 ENCOUNTER — Encounter: Payer: Medicaid Other | Admitting: Obstetrics and Gynecology

## 2020-10-12 ENCOUNTER — Ambulatory Visit (INDEPENDENT_AMBULATORY_CARE_PROVIDER_SITE_OTHER): Payer: Medicaid Other | Admitting: Obstetrics

## 2020-10-12 ENCOUNTER — Other Ambulatory Visit
Admission: RE | Admit: 2020-10-12 | Discharge: 2020-10-12 | Disposition: A | Discharge status: Home / Self Care | Payer: Medicaid Other | Source: Ambulatory Visit | Attending: Obstetrics | Admitting: Obstetrics

## 2020-10-12 ENCOUNTER — Other Ambulatory Visit: Payer: Self-pay

## 2020-10-12 VITALS — BP 118/70 | Wt 243.6 lb

## 2020-10-12 DIAGNOSIS — Z20822 Contact with and (suspected) exposure to covid-19: Secondary | ICD-10-CM | POA: Diagnosis not present

## 2020-10-12 DIAGNOSIS — Z01812 Encounter for preprocedural laboratory examination: Secondary | ICD-10-CM | POA: Diagnosis not present

## 2020-10-12 DIAGNOSIS — Z3403 Encounter for supervision of normal first pregnancy, third trimester: Secondary | ICD-10-CM

## 2020-10-12 DIAGNOSIS — Z3A40 40 weeks gestation of pregnancy: Secondary | ICD-10-CM

## 2020-10-12 LAB — POCT URINALYSIS DIPSTICK OB
Glucose, UA: NEGATIVE
POC,PROTEIN,UA: NEGATIVE

## 2020-10-12 LAB — SARS CORONAVIRUS 2 (TAT 6-24 HRS): SARS Coronavirus 2: NEGATIVE

## 2020-10-12 NOTE — H&P (Addendum)
Karen Benton is a 25 y.o. female presenting for elective induction of labor at 40 weeks 5 days.. OB History    Gravida  1   Para  0   Term  0   Preterm  0   AB  0   Living        SAB  0   IAB  0   Ectopic  0   Multiple      Live Births             Past Medical History:  Diagnosis Date  . BRCA negative 01/2020   MyRisk neg  . Bronchitis   . Family history of breast cancer   . Family history of ovarian cancer   . Increased risk of breast cancer 01/2020   riskscore=25.8%/IBIS=33.8%   No past surgical history on file. Family History: family history includes Breast cancer (age of onset: 72) in her mother; Breast cancer (age of onset: 73) in her maternal grandmother; Breast cancer (age of onset: 69) in her maternal aunt; Ovarian cancer (age of onset: 31) in her mother; Skin cancer (age of onset: 34) in her maternal grandfather. Social History:  reports that she has never smoked. She has never used smokeless tobacco. She reports current alcohol use. She reports that she does not use drugs.     Maternal Diabetes: No Genetic Screening: Normal Maternal Ultrasounds/Referrals: Normal Fetal Ultrasounds or other Referrals:  None Maternal Substance Abuse:  No Significant Maternal Medications:  None Significant Maternal Lab Results:  Group B Strep negative Other Comments:  None  Review of Systems  Constitutional: Negative.   HENT: Negative.   Eyes: Negative.   Respiratory: Negative.   Cardiovascular: Negative.   Gastrointestinal: Negative.   Endocrine: Negative.   Genitourinary: Negative.   Musculoskeletal: Negative.   Skin: Negative.   Allergic/Immunologic: Negative.   Neurological: Negative.   Hematological: Negative.   Psychiatric/Behavioral: Negative.    History   Blood pressure 118/70, weight 243 lb 9.6 oz (110.5 kg), last menstrual period 01/03/2020. Maternal Exam:  Uterine Assessment: Contraction frequency is rare.   Abdomen: Patient reports no  abdominal tenderness. Fundal height is 40 cms.   Estimated fetal weight is 8.5 lbs.   Fetal presentation: vertex  Introitus: Normal vulva. Normal vagina.  Ferning test: not done.  Nitrazine test: not done.  Pelvis: adequate for delivery.   Cervix: Cervix evaluated by digital exam.     Physical Exam Constitutional:      Appearance: Normal appearance. She is obese.  HENT:     Head: Normocephalic and atraumatic.     Nose: Nose normal.  Cardiovascular:     Rate and Rhythm: Normal rate and regular rhythm.     Pulses: Normal pulses.     Heart sounds: Normal heart sounds.  Pulmonary:     Effort: Pulmonary effort is normal.     Breath sounds: Normal breath sounds.  Abdominal:     Palpations: Abdomen is soft.  Genitourinary:    General: Normal vulva.     Comments: SVE: 2 cms/50%/-2 station medium soft Bishop score 5 Musculoskeletal:        General: Normal range of motion.     Cervical back: Normal range of motion and neck supple.  Skin:    General: Skin is warm and dry.  Neurological:     General: No focal deficit present.     Mental Status: She is alert and oriented to person, place, and time.  Psychiatric:  Mood and Affect: Mood normal.        Behavior: Behavior normal.     Prenatal labs: ABO, Rh: A/Positive/-- (08/12 1437) Antibody: Negative (08/12 1437) Rubella: 2.77 (08/12 1437) RPR: Non Reactive (12/29 1034)  HBsAg: Negative (08/12 1437)  HIV: Non Reactive (12/29 1034)  GBS: Negative/-- (02/23 1612)   Assessment/Plan: IUP 40 weeks 5 days for scheduled elective induction EFW >8 lbs. Discussed carefully the induction process, including cytotec, pitocin. Pain control IV med and possible epidural. Routine orders for admission and induction placed.   Imagene Riches 10/12/2020, 11:58 AM

## 2020-10-12 NOTE — Progress Notes (Deleted)
  The note originally documented on this encounter has been moved the the encounter in which it belongs.  

## 2020-10-13 ENCOUNTER — Emergency Department
Admission: EM | Admit: 2020-10-13 | Discharge: 2020-10-13 | Disposition: A | Discharge status: Home / Self Care | Payer: Medicaid Other | Source: Home / Self Care | Attending: Emergency Medicine | Admitting: Emergency Medicine

## 2020-10-13 ENCOUNTER — Encounter: Payer: Self-pay | Admitting: Emergency Medicine

## 2020-10-13 ENCOUNTER — Other Ambulatory Visit: Payer: Self-pay

## 2020-10-13 DIAGNOSIS — O99891 Other specified diseases and conditions complicating pregnancy: Secondary | ICD-10-CM | POA: Insufficient documentation

## 2020-10-13 DIAGNOSIS — H6692 Otitis media, unspecified, left ear: Secondary | ICD-10-CM

## 2020-10-13 DIAGNOSIS — Z3A40 40 weeks gestation of pregnancy: Secondary | ICD-10-CM | POA: Insufficient documentation

## 2020-10-13 MED ORDER — AMOXICILLIN 500 MG PO CAPS
500.0000 mg | ORAL_CAPSULE | Freq: Once | ORAL | Status: AC
  Administered 2020-10-13: 500 mg via ORAL
  Filled 2020-10-13: qty 1

## 2020-10-13 MED ORDER — AMOXICILLIN 875 MG PO TABS: 875.0000 mg | ORAL_TABLET | Freq: Two times a day (BID) | ORAL | 0 refills | Status: DC

## 2020-10-13 MED ORDER — ACETAMINOPHEN 500 MG PO TABS
1000.0000 mg | ORAL_TABLET | Freq: Once | ORAL | Status: AC
  Administered 2020-10-13: 1000 mg via ORAL
  Filled 2020-10-13: qty 2

## 2020-10-13 NOTE — ED Notes (Signed)
FHTs 140 located to rt lower abd

## 2020-10-13 NOTE — ED Triage Notes (Signed)
Patient ambulatory to triage with steady gait, without difficulty or distress noted; pt reports left earache x wk; denies any recent illness, denies any accomp symptoms; tylenol taken PTA with some relief; pt [redacted]wks pregnant

## 2020-10-13 NOTE — ED Notes (Signed)
Pt endorses L ear pain x1 week, worsening tonight. Pt reports one day this past week she also had sore throat. Denies fever/chills, cough, headache, runny nose. 107w4d pregnant, denies issues with baby. AAOx4 NAD.

## 2020-10-13 NOTE — Discharge Instructions (Signed)
You may take Tylenol 1000 mg every 6 hours as needed for fever, pain.  Steps to find a Primary Care Provider (PCP):  Call [phone] or [phone] to access "[facility] Find a Doctor Service."  2.  You may also go on the [facility] website at [email or web address]

## 2020-10-13 NOTE — ED Provider Notes (Signed)
[facility] Emergency Department Provider Note  ____________________________________________   Event Date/Time   First MD Initiated Contact with Patient 10/13/20 [phone]     (approximate)  I have reviewed the triage vital signs and the nursing notes.   HISTORY  Chief Complaint Otalgia    HPI Karen Benton is a 25 y.o. female with history of recurrent ear infections who presents to the emergency department 1 week of left ear pain.  No fever, cough.  Did have sore throat that has resolved.  She is currently pregnant and is going to be induced tomorrow.  No abdominal pain, vaginal bleeding or discharge, leaking fluid.  Feeling her baby move normally.        Past Medical History:  Diagnosis Date  . BRCA negative 01/2020   MyRisk neg  . Bronchitis   . Family history of breast cancer   . Family history of ovarian cancer   . Increased risk of breast cancer 01/2020   riskscore=25.8%/IBIS=33.8%    Patient Active Problem List   Diagnosis Date Noted  . Marginal insertion of umbilical cord affecting management of mother in third trimester 09/09/2020  . Encounter for supervision of normal first pregnancy in third trimester 03/05/2020    History reviewed. No pertinent surgical history.  Prior to Admission medications   Medication Sig Start Date End Date Taking? Authorizing Provider  amoxicillin (AMOXIL) 875 MG tablet Take 1 tablet (875 mg total) by mouth 2 (two) times daily. 10/13/20  Yes Chia Mowers, Delice Bison, DO    Allergies Patient has no known allergies.  Family History  Problem Relation Age of Onset  . Breast cancer Mother 39       Aggressive Stage 4-4x  . Ovarian cancer Mother 50  . Breast cancer Maternal Grandmother 24  . Skin cancer Maternal Grandfather 63       Facial  . Breast cancer Maternal Aunt 60    Social History Social History   Tobacco Use  . Smoking status: Never Smoker  . Smokeless tobacco: Never Used  Vaping Use  . Vaping  Use: Never used  Substance Use Topics  . Alcohol use: Yes  . Drug use: Never    Review of Systems Constitutional: No fever. Eyes: No visual changes. ENT: No sore throat. Cardiovascular: Denies chest pain. Respiratory: Denies shortness of breath. Gastrointestinal: No nausea, vomiting, diarrhea. Genitourinary: Negative for dysuria. Musculoskeletal: Negative for back pain. Skin: Negative for rash. Neurological: Negative for focal weakness or numbness.  ____________________________________________   PHYSICAL EXAM:  VITAL SIGNS: ED Triage Vitals  Enc Vitals Group     BP 10/13/20 0357 120/71     Pulse Rate 10/13/20 0357 75     Resp 10/13/20 0357 18     Temp 10/13/20 0357 97.8 F (36.6 C)     Temp Source 10/13/20 0357 Oral     SpO2 10/13/20 0357 99 %     Weight 10/13/20 0359 243 lb (110.2 kg)     Height 10/13/20 0359 5' 3"  (1.6 m)     Head Circumference --      Peak Flow --      Pain Score 10/13/20 0357 4     Pain Loc --      Pain Edu? --      Excl. in [place]? --    CONSTITUTIONAL: Alert and oriented and responds appropriately to questions. Well-appearing; well-nourished HEAD: Normocephalic EYES: Conjunctivae clear, pupils appear equal, EOM appear intact ENT: normal nose; moist mucous membranes; right  TM is clear without erythema, purulence, bulging, perforation, effusion.  Left TM is erythematous and bulging with minimal effusion.  No cerumen impaction or sign of foreign body in the external auditory canal. No inflammation, erythema or drainage from the external auditory canal. No signs of mastoiditis. No pain with manipulation of the pinna bilaterally.  No pharyngeal erythema or petechiae, no tonsillar hypertrophy or exudate, no uvular deviation, no unilateral swelling, no trismus or drooling, no muffled voice, normal phonation, no stridor, no dental caries present, no drainable dental abscess noted, no Ludwig's angina, tongue sits flat in the bottom of the mouth, no angioedema,  no facial erythema or warmth, no facial swelling. NECK: Supple, normal ROM CARD: RRR; S1 and S2 appreciated; no murmurs, no clicks, no rubs, no gallops RESP: Normal chest excursion without splinting or tachypnea; breath sounds clear and equal bilaterally; no wheezes, no rhonchi, no rales, no hypoxia or respiratory distress, speaking full sentences ABD/GI: Normal bowel sounds; gravid uterus; soft, non-tender, no rebound, no guarding, no peritoneal signs, no hepatosplenomegaly BACK: The back appears normal EXT: Normal ROM in all joints; no deformity noted, no edema; no cyanosis SKIN: Normal color for age and race; warm; no rash on exposed skin NEURO: Moves all extremities equally PSYCH: The patient's mood and manner are appropriate.  ____________________________________________   LABS (all labs ordered are listed, but only abnormal results are displayed)  Labs Reviewed - No data to display ____________________________________________  EKG  none ____________________________________________  RADIOLOGY I, Ranae Casebier, personally viewed and evaluated these images (plain radiographs) as part of my medical decision making, as well as reviewing the written report by the radiologist.  ED MD interpretation:  none  Official radiology report(s): No results found.  ____________________________________________   PROCEDURES  Procedure(s) performed (including Critical Care):  Procedures  ____________________________________________   INITIAL IMPRESSION / ASSESSMENT AND PLAN / ED COURSE  As part of my medical decision making, I reviewed the following data within the [place] notes reviewed and incorporated, Old chart reviewed and Notes from prior ED visits         Patient here with otitis media.  No other associated symptoms.  Afebrile, nontoxic.  She is pregnant and denies any abdominal pain, vaginal bleeding, leaking fluid.  Feeling her baby move  normally and fetal heart rate in the 140s in triage.  Will discharge on amoxicillin given she reports this is helped her ear infections before.  Recommended Tylenol for pain.  At this time, I do not feel there is any life-threatening condition present. I have reviewed, interpreted and discussed all results (EKG, imaging, lab, urine as appropriate) and exam findings with patient/family. I have reviewed nursing notes and appropriate previous records.  I feel the patient is safe to be discharged home without further emergent workup and can continue workup as an outpatient as needed. Discussed usual and customary return precautions. Patient/family verbalize understanding and are comfortable with this plan.  Outpatient follow-up has been provided as needed. All questions have been answered.  ____________________________________________   FINAL CLINICAL IMPRESSION(S) / ED DIAGNOSES  Final diagnoses:  Acute left otitis media     ED Discharge Orders         Ordered    amoxicillin (AMOXIL) 875 MG tablet  2 times daily        10/13/20 4696          *Please note:  Karen Benton was evaluated in Emergency Department on 10/13/2020 for the symptoms described in  the history of present illness. She was evaluated in the context of the global COVID-19 pandemic, which necessitated consideration that the patient might be at risk for infection with the SARS-CoV-2 virus that causes COVID-19. Institutional protocols and algorithms that pertain to the evaluation of patients at risk for COVID-19 are in a state of rapid change based on information released by regulatory bodies including the CDC and federal and state organizations. These policies and algorithms were followed during the patient's care in the ED.  Some ED evaluations and interventions may be delayed as a result of limited staffing during and the pandemic.*   Note:  This document was prepared using Dragon voice recognition software and may include  unintentional dictation errors.   Giannamarie Paulus, Delice Bison, DO 10/13/20 [phone]

## 2020-10-13 NOTE — ED Notes (Signed)
Orders verified with Ward DO to give 1000mg  amoxicillin

## 2020-10-14 ENCOUNTER — Other Ambulatory Visit: Payer: Self-pay

## 2020-10-14 ENCOUNTER — Inpatient Hospital Stay: Payer: Medicaid Other | Admitting: Anesthesiology

## 2020-10-14 ENCOUNTER — Inpatient Hospital Stay
Admission: EM | Admit: 2020-10-14 | Discharge: 2020-10-17 | DRG: 787 | Disposition: A | Discharge status: Home / Self Care | Payer: Medicaid Other | Attending: Obstetrics & Gynecology | Admitting: Obstetrics & Gynecology

## 2020-10-14 ENCOUNTER — Encounter: Payer: Self-pay | Admitting: Obstetrics

## 2020-10-14 DIAGNOSIS — O480 Post-term pregnancy: Principal | ICD-10-CM | POA: Diagnosis present

## 2020-10-14 DIAGNOSIS — Z3A40 40 weeks gestation of pregnancy: Secondary | ICD-10-CM

## 2020-10-14 DIAGNOSIS — O9081 Anemia of the puerperium: Secondary | ICD-10-CM | POA: Diagnosis not present

## 2020-10-14 DIAGNOSIS — O3663X0 Maternal care for excessive fetal growth, third trimester, not applicable or unspecified: Secondary | ICD-10-CM | POA: Diagnosis present

## 2020-10-14 DIAGNOSIS — Z3490 Encounter for supervision of normal pregnancy, unspecified, unspecified trimester: Secondary | ICD-10-CM | POA: Diagnosis present

## 2020-10-14 DIAGNOSIS — O339 Maternal care for disproportion, unspecified: Secondary | ICD-10-CM | POA: Diagnosis present

## 2020-10-14 DIAGNOSIS — D62 Acute posthemorrhagic anemia: Secondary | ICD-10-CM | POA: Diagnosis not present

## 2020-10-14 DIAGNOSIS — Z23 Encounter for immunization: Secondary | ICD-10-CM

## 2020-10-14 DIAGNOSIS — O99214 Obesity complicating childbirth: Secondary | ICD-10-CM | POA: Diagnosis present

## 2020-10-14 DIAGNOSIS — O338 Maternal care for disproportion of other origin: Secondary | ICD-10-CM | POA: Diagnosis not present

## 2020-10-14 DIAGNOSIS — O3660X0 Maternal care for excessive fetal growth, unspecified trimester, not applicable or unspecified: Secondary | ICD-10-CM | POA: Diagnosis not present

## 2020-10-14 LAB — CBC
HCT: 32.2 % — ABNORMAL LOW (ref 36.0–46.0)
Hemoglobin: 10.8 g/dL — ABNORMAL LOW (ref 12.0–15.0)
MCH: 28.9 pg (ref 26.0–34.0)
MCHC: 33.5 g/dL (ref 30.0–36.0)
MCV: 86.1 fL (ref 80.0–100.0)
Platelets: 231 10*3/uL (ref 150–400)
RBC: 3.74 MIL/uL — ABNORMAL LOW (ref 3.87–5.11)
RDW: 14.6 % (ref 11.5–15.5)
WBC: 10.9 10*3/uL — ABNORMAL HIGH (ref 4.0–10.5)
nRBC: 0 % (ref 0.0–0.2)

## 2020-10-14 LAB — TYPE AND SCREEN
ABO/RH(D): A POS
Antibody Screen: NEGATIVE

## 2020-10-14 LAB — RPR: RPR Ser Ql: NONREACTIVE

## 2020-10-14 MED ORDER — LACTATED RINGERS IV SOLN
INTRAVENOUS | Status: DC
  Administered 2020-10-14: 1000 mL via INTRAVENOUS
  Administered 2020-10-14: 125 mL via INTRAVENOUS

## 2020-10-14 MED ORDER — FENTANYL 2.5 MCG/ML W/ROPIVACAINE 0.15% IN NS 100 ML EPIDURAL (ARMC)
EPIDURAL | Status: AC
  Administered 2020-10-15: 12 mL/h via EPIDURAL
  Filled 2020-10-14: qty 100

## 2020-10-14 MED ORDER — LIDOCAINE-EPINEPHRINE (PF) 1.5 %-1:200000 IJ SOLN
INTRAMUSCULAR | Status: DC | PRN
  Administered 2020-10-14: 3 mL via EPIDURAL

## 2020-10-14 MED ORDER — OXYTOCIN-SODIUM CHLORIDE 30-0.9 UT/500ML-% IV SOLN
1.0000 m[IU]/min | INTRAVENOUS | Status: DC
  Administered 2020-10-14: 2 m[IU]/min via INTRAVENOUS
  Filled 2020-10-14: qty 500

## 2020-10-14 MED ORDER — LACTATED RINGERS IV SOLN
500.0000 mL | INTRAVENOUS | Status: DC | PRN
  Administered 2020-10-15: 1000 mL via INTRAVENOUS

## 2020-10-14 MED ORDER — PHENYLEPHRINE 40 MCG/ML (10ML) SYRINGE FOR IV PUSH (FOR BLOOD PRESSURE SUPPORT)
80.0000 ug | PREFILLED_SYRINGE | INTRAVENOUS | Status: DC | PRN
  Administered 2020-10-15 (×2): 80 ug via INTRAVENOUS
  Filled 2020-10-14: qty 10

## 2020-10-14 MED ORDER — TERBUTALINE SULFATE 1 MG/ML IJ SOLN: 0.2500 mg | Freq: Once | INTRAMUSCULAR | Status: DC | PRN

## 2020-10-14 MED ORDER — OXYTOCIN-SODIUM CHLORIDE 30-0.9 UT/500ML-% IV SOLN: 2.5000 [IU]/h | INTRAVENOUS | Status: DC

## 2020-10-14 MED ORDER — EPHEDRINE 5 MG/ML INJ
10.0000 mg | INTRAVENOUS | Status: DC | PRN
  Administered 2020-10-14: 10 mg via INTRAVENOUS
  Filled 2020-10-14: qty 4

## 2020-10-14 MED ORDER — EPHEDRINE 5 MG/ML INJ
10.0000 mg | INTRAVENOUS | Status: AC | PRN
  Administered 2020-10-14 – 2020-10-15 (×2): 10 mg via INTRAVENOUS
  Filled 2020-10-14: qty 4

## 2020-10-14 MED ORDER — LACTATED RINGERS IV SOLN
500.0000 mL | Freq: Once | INTRAVENOUS | Status: AC
  Administered 2020-10-14: 500 mL via INTRAVENOUS

## 2020-10-14 MED ORDER — OXYCODONE-ACETAMINOPHEN 5-325 MG PO TABS: 1.0000 | ORAL_TABLET | ORAL | Status: DC | PRN

## 2020-10-14 MED ORDER — PHENYLEPHRINE 40 MCG/ML (10ML) SYRINGE FOR IV PUSH (FOR BLOOD PRESSURE SUPPORT)
80.0000 ug | PREFILLED_SYRINGE | INTRAVENOUS | Status: DC | PRN
  Administered 2020-10-15: 80 ug via INTRAVENOUS

## 2020-10-14 MED ORDER — OXYTOCIN BOLUS FROM INFUSION: 333.0000 mL | Freq: Once | INTRAVENOUS | Status: DC

## 2020-10-14 MED ORDER — MISOPROSTOL 25 MCG QUARTER TABLET
25.0000 ug | ORAL_TABLET | ORAL | Status: DC | PRN
  Administered 2020-10-14 (×2): 25 ug via VAGINAL
  Filled 2020-10-14 (×3): qty 1

## 2020-10-14 MED ORDER — FENTANYL 2.5 MCG/ML W/ROPIVACAINE 0.15% IN NS 100 ML EPIDURAL (ARMC)
12.0000 mL/h | EPIDURAL | Status: DC
Start: 2020-10-14 — End: 2020-10-15
  Administered 2020-10-14: 12 mL/h via EPIDURAL
  Filled 2020-10-14 (×2): qty 100

## 2020-10-14 MED ORDER — BUPIVACAINE HCL (PF) 0.25 % IJ SOLN
INTRAMUSCULAR | Status: DC | PRN
  Administered 2020-10-14 (×2): 4 mL via EPIDURAL
  Administered 2020-10-15: 5 mL via EPIDURAL

## 2020-10-14 MED ORDER — DIPHENHYDRAMINE HCL 50 MG/ML IJ SOLN: 12.5000 mg | INTRAMUSCULAR | Status: DC | PRN

## 2020-10-14 MED ORDER — OXYCODONE-ACETAMINOPHEN 5-325 MG PO TABS: 2.0000 | ORAL_TABLET | ORAL | Status: DC | PRN

## 2020-10-14 MED ORDER — ACETAMINOPHEN 325 MG PO TABS: 650.0000 mg | ORAL_TABLET | ORAL | Status: DC | PRN

## 2020-10-14 MED ORDER — LIDOCAINE HCL (PF) 1 % IJ SOLN
INTRAMUSCULAR | Status: DC | PRN
  Administered 2020-10-14: 2 mL via SUBCUTANEOUS

## 2020-10-14 MED ORDER — ONDANSETRON HCL 4 MG/2ML IJ SOLN: 4.0000 mg | Freq: Four times a day (QID) | INTRAMUSCULAR | Status: DC | PRN

## 2020-10-14 MED ORDER — LIDOCAINE HCL (PF) 1 % IJ SOLN
30.0000 mL | INTRAMUSCULAR | Status: DC | PRN
  Filled 2020-10-14: qty 30

## 2020-10-14 MED ORDER — SOD CITRATE-CITRIC ACID 500-334 MG/5ML PO SOLN: 30.0000 mL | ORAL | Status: DC | PRN

## 2020-10-14 MED ORDER — AMOXICILLIN-POT CLAVULANATE 875-125 MG PO TABS
1.0000 | ORAL_TABLET | Freq: Two times a day (BID) | ORAL | Status: DC
  Administered 2020-10-14 (×2): 1 via ORAL
  Filled 2020-10-14 (×4): qty 1

## 2020-10-14 NOTE — Progress Notes (Signed)
Karen Benton is a 25 y.o. G1P0000 at [redacted]w[redacted]d by ultrasound admitted for induction of labor due to Post dates. Due date 10/09/2020.  Subjective: Karen Benton has been using the labor ball and kis comfortable. She is more aware of her contractions, and feels them in the front and lower.  Some bloody show noted.   Objective: BP 115/72 (BP Location: Left Arm)   Pulse 96   Temp 98.3 F (36.8 C) (Oral)   Resp 16   Ht 5\' 3"  (1.6 m)   Wt 110.2 kg   LMP 01/03/2020   BMI 43.05 kg/m  No intake/output data recorded. Total I/O In: 120 [P.O.:120] Out: -   FHT:  FHR: 125 baseline bpm, variability: moderate,  accelerations:  Present,  decelerations:  Absent UC:   regular, every 3 minutes SVE:   Dilation: 3 Effacement (%): 60 Station: -3 Exam by:: [email or web address], CNM  Bishop score:6  Labs: Lab Results  Component Value Date   WBC 10.9 (H) 10/14/2020   HGB 10.8 (L) 10/14/2020   HCT 32.2 (L) 10/14/2020   MCV 86.1 10/14/2020   PLT 231 10/14/2020    Assessment / Plan: Induction of labor due to term with favorable cervix and postterm  Labor: Progressing normally  With cervical ripening.  Fetal Wellbeing:  Category I Pain Control:  Labor support without medications I/D:  n/a Anticipated MOD:  NSVD   As she is contracting more than three times each 10 minutes , will hold on another cytotec and recheck in 1-2 hours. Anticipate SROM or pitocin to follow.  10/16/2020 10/14/2020, 3:22 PM

## 2020-10-14 NOTE — Progress Notes (Signed)
Karen Benton is a 25 y.o. G1P0000 at [redacted]w[redacted]d by ultrasound admitted for induction of labor due to Elective at term.  Subjective: she has eaten dinner. Starting to look uncomfortable facially with her contractions.   Objective: BP 108/70 (BP Location: Left Arm)   Pulse 83   Temp 97.9 F (36.6 C) (Oral)   Resp 18   Ht 5\' 3"  (1.6 m)   Wt 110.2 kg   LMP 01/03/2020   BMI 43.05 kg/m  No intake/output data recorded. Total I/O In: 120 [P.O.:120] Out: -   FHT:  FHR: 130 baseline bpm, variability: moderate,  accelerations:  Present,  decelerations:  Absent UC:   regular, every 2-3 minutes SVE:   Dilation: 3.5 Effacement (%): 70 Station: -3 Exam by:: [email or web address], CNM  Bishop score: 6  Labs: Lab Results  Component Value Date   WBC 10.9 (H) 10/14/2020   HGB 10.8 (L) 10/14/2020   HCT 32.2 (L) 10/14/2020   MCV 86.1 10/14/2020   PLT 231 10/14/2020    Assessment / Plan: Induction of labor due to term with favorable cervix  Labor: steady contraction pattern- AROM performed- clear fluid seen  Fetal Wellbeing:  Category I Pain Control:  Labor support without medications I/D:  n/a Anticipated MOD:  NSVD   Discussed AROM and received consent prior to AROM . Expectant management. Anticipate epidural anesthesia once uncomfortable.  10/16/2020 10/14/2020, 6:25 PM

## 2020-10-14 NOTE — Anesthesia Preprocedure Evaluation (Addendum)
Anesthesia Evaluation  Patient identified by MRN, date of birth, ID band Patient awake    Reviewed: Allergy & Precautions, H&P , NPO status , Patient's Chart, lab work & pertinent test results, reviewed documented beta blocker date and time   History of Anesthesia Complications Negative for: history of anesthetic complications  Airway Mallampati: II  TM Distance: >3 FB Neck ROM: full    Dental  (+) Chipped   Pulmonary neg shortness of breath,    Pulmonary exam normal breath sounds clear to auscultation       Cardiovascular Exercise Tolerance: Poor (-) hypertension(-) Past MI negative cardio ROS Normal cardiovascular exam     Neuro/Psych negative neurological ROS  negative psych ROS   GI/Hepatic negative GI ROS, Neg liver ROS, neg GERD  ,  Endo/Other  neg diabetesMorbid obesity  Renal/GU negative Renal ROS  negative genitourinary   Musculoskeletal   Abdominal   Peds  Hematology negative hematology ROS (+)   Anesthesia Other Findings Past Medical History: 01/2020: BRCA negative     Comment:  MyRisk neg No date: Bronchitis No date: Family history of breast cancer No date: Family history of ovarian cancer 01/2020: Increased risk of breast cancer     Comment:  riskscore=25.8%/IBIS=33.8%   Reproductive/Obstetrics (+) Pregnancy                            Anesthesia Physical Anesthesia Plan  ASA: III  Anesthesia Plan: Spinal   Post-op Pain Management:    Induction:   PONV Risk Score and Plan:   Airway Management Planned: Natural Airway and Nasal Cannula  Additional Equipment:   Intra-op Plan:   Post-operative Plan:   Informed Consent: I have reviewed the patients History and Physical, chart, labs and discussed the procedure including the risks, benefits and alternatives for the proposed anesthesia with the patient or authorized representative who has indicated his/her  understanding and acceptance.     Dental Advisory Given  Plan Discussed with: Anesthesiologist, CRNA and Surgeon  Anesthesia Plan Comments: (Patient reports no bleeding problems and no anticoagulant use.  Plan for spinal with backup GA  Patient consented for risks of anesthesia including but not limited to:  - adverse reactions to medications - damage to eyes, teeth, lips or other oral mucosa - nerve damage due to positioning  - risk of bleeding, infection and or nerve damage from spinal that could lead to paralysis - risk of headache or failed spinal - damage to teeth, lips or other oral mucosa - sore throat or hoarseness - damage to heart, brain, nerves, lungs, other parts of body or loss of life  Patient voiced understanding.)       Anesthesia Quick Evaluation

## 2020-10-14 NOTE — Anesthesia Procedure Notes (Signed)
Epidural Patient location during procedure: OB Start time: 10/14/2020 8:12 PM End time: 10/14/2020 8:15 PM  Staffing Anesthesiologist: Lenard Simmer, MD Resident/CRNA: Irving Burton, CRNA Performed: resident/CRNA   Preanesthetic Checklist Completed: patient identified, IV checked, site marked, risks and benefits discussed, surgical consent, monitors and equipment checked, pre-op evaluation and timeout performed  Epidural Patient position: sitting Prep: ChloraPrep Patient monitoring: heart rate, continuous pulse ox and blood pressure Approach: midline Location: L3-L4 Injection technique: LOR saline  Needle:  Needle type: Tuohy  Needle gauge: 17 G Needle length: 9 cm and 9 Needle insertion depth: 6 cm Catheter type: closed end flexible Catheter size: 19 Gauge Catheter at skin depth: 11 cm Test dose: negative and 1.5% lidocaine with Epi 1:200 K  Assessment Sensory level: T10 Events: blood not aspirated, injection not painful, no injection resistance, no paresthesia and negative IV test  Additional Notes 1st attempt Pt. Evaluated and documentation done after procedure finished. Patient identified. Risks/Benefits/Options discussed with patient including but not limited to bleeding, infection, nerve damage, paralysis, failed block, incomplete pain control, headache, blood pressure changes, nausea, vomiting, reactions to medication both or allergic, itching and postpartum back pain. Confirmed with bedside nurse the patient's most recent platelet count. Confirmed with patient that they are not currently taking any anticoagulation, have any bleeding history or any family history of bleeding disorders. Patient expressed understanding and wished to proceed. All questions were answered. Sterile technique was used throughout the entire procedure. Please see nursing notes for vital signs. Test dose was given through epidural catheter and negative prior to continuing to dose epidural or  start infusion. Warning signs of high block given to the patient including shortness of breath, tingling/numbness in hands, complete motor block, or any concerning symptoms with instructions to call for help. Patient was given instructions on fall risk and not to get out of bed. All questions and concerns addressed with instructions to call with any issues or inadequate analgesia.   Patient tolerated the insertion well without immediate complications.Reason for block:procedure for pain

## 2020-10-14 NOTE — Progress Notes (Signed)
Karen Benton is a 25 y.o. G1P0000 at [redacted]w[redacted]d by ultrasound admitted for induction of labor due to Post dates. Due date 10/09/2020.  Subjective:  She has been sleeping since her first cytotec was placed 4 hours ago. Resting quietly; she is not feeling painful contractions. She denies any LOF. Her baby has been moving well.   Objective: BP 115/72 (BP Location: Left Arm)   Pulse 96   Temp 98.3 F (36.8 C) (Oral)   Resp 16   Ht 5\' 3"  (1.6 m)   Wt 110.2 kg   LMP 01/03/2020   BMI 43.05 kg/m  No intake/output data recorded. Total I/O In: 120 [P.O.:120] Out: -   FHT:  FHR: 120-130 baseline bpm, variability: moderate,  accelerations:  Present,  decelerations:  Absent UC:   irregular, every 3-6 minutes. Mild to palpation SVE:   Dilation: 2.5 Effacement (%): 60 Station: -3 Exam by:: [email or web address], CNM  Labs: Lab Results  Component Value Date   WBC 10.9 (H) 10/14/2020   HGB 10.8 (L) 10/14/2020   HCT 32.2 (L) 10/14/2020   MCV 86.1 10/14/2020   PLT 231 10/14/2020    Assessment / Plan: Induction of labor due to term with favorable cervix and postterm,  progressing well on pitocin  Labor: cervical ripening with cytotec. Second dose cytotec placed vaginally by the CNM  Fetal Wellbeing:  Category I Pain Control:  Labor support without medications I/D:  n/a Anticipated MOD:  NSVD   Will recheck in 4 hours. Consider pitocin augmentation once Bishop score is at least 6.  10/16/2020 10/14/2020, 10:30 AM

## 2020-10-14 NOTE — H&P (Signed)
Date of Initial H&P: 10/12/2020  25 y.o. G1P0000 presenting for postdates IOL.     Vena Austria, MD, [facility], [facility] 10/14/2020, 7:52 AM

## 2020-10-14 NOTE — Progress Notes (Signed)
Karen Benton is a 25 y.o. G1P0000 at [redacted]w[redacted]d by ultrasound admitted for induction of labor due to Post dates. Due date 10/09/2020.  Subjective:has just received an epidural and is feeling more comfortable   Objective: BP 107/66   Pulse 63   Temp 97.9 F (36.6 C) (Oral)   Resp 18   Ht 5\' 3"  (1.6 m)   Wt 110.2 kg   LMP 01/03/2020   SpO2 95%   BMI 43.05 kg/m  I/O last 3 completed shifts: In: 120 [P.O.:120] Out: -  No intake/output data recorded.  FHT:  FHR: 125 baseline bpm, variability: moderate,  accelerations:  Present,  decelerations:  Absent UC:   regular, every 2-3 minutes SVE:   Dilation: 3.5 Effacement (%): 80 Station: -3 Exam by:: [email or web address]: Lab Results  Component Value Date   WBC 10.9 (H) 10/14/2020   HGB 10.8 (L) 10/14/2020   HCT 32.2 (L) 10/14/2020   MCV 86.1 10/14/2020   PLT 231 10/14/2020    Assessment / Plan: Induction of labor due to postterm.  Labor: Progressing normally from two doses of cytotec  Fetal Wellbeing:  Category I Pain Control:  Epidural I/D:  n/a Anticipated MOD:  NSVD  IUPC placed by CNM. Anticipate augmentation with pitocin.  10/16/2020 10/14/2020, 8:49 PM

## 2020-10-15 ENCOUNTER — Encounter
Admission: EM | Disposition: A | Discharge status: Home / Self Care | Payer: Self-pay | Source: Home / Self Care | Attending: Obstetrics & Gynecology

## 2020-10-15 ENCOUNTER — Encounter: Payer: Self-pay | Admitting: Obstetrics and Gynecology

## 2020-10-15 DIAGNOSIS — Z3A40 40 weeks gestation of pregnancy: Secondary | ICD-10-CM

## 2020-10-15 DIAGNOSIS — O480 Post-term pregnancy: Principal | ICD-10-CM

## 2020-10-15 DIAGNOSIS — O3660X0 Maternal care for excessive fetal growth, unspecified trimester, not applicable or unspecified: Secondary | ICD-10-CM

## 2020-10-15 DIAGNOSIS — O338 Maternal care for disproportion of other origin: Secondary | ICD-10-CM

## 2020-10-15 SURGERY — Surgical Case
Anesthesia: Epidural

## 2020-10-15 MED ORDER — KETOROLAC TROMETHAMINE 30 MG/ML IJ SOLN
INTRAMUSCULAR | Status: AC
  Administered 2020-10-15: 30 mg via INTRAVENOUS
  Filled 2020-10-15: qty 1

## 2020-10-15 MED ORDER — LACTATED RINGERS IV SOLN: INTRAVENOUS | Status: DC

## 2020-10-15 MED ORDER — SENNOSIDES-DOCUSATE SODIUM 8.6-50 MG PO TABS
2.0000 | ORAL_TABLET | ORAL | Status: DC
  Administered 2020-10-15 – 2020-10-16 (×2): 2 via ORAL
  Filled 2020-10-15 (×2): qty 2

## 2020-10-15 MED ORDER — MORPHINE SULFATE (PF) 2 MG/ML IV SOLN: 1.0000 mg | INTRAVENOUS | Status: DC | PRN

## 2020-10-15 MED ORDER — LACTATED RINGERS IV BOLUS
1000.0000 mL | Freq: Once | INTRAVENOUS | Status: AC
  Administered 2020-10-15: 1000 mL via INTRAVENOUS

## 2020-10-15 MED ORDER — NALBUPHINE HCL 10 MG/ML IJ SOLN
5.0000 mg | Freq: Once | INTRAMUSCULAR | Status: DC | PRN
Start: 2020-10-15 — End: 2020-10-17

## 2020-10-15 MED ORDER — BUPIVACAINE ON-Q PAIN PUMP (FOR ORDER SET NO CHG)
INJECTION | Status: DC
  Filled 2020-10-15: qty 1

## 2020-10-15 MED ORDER — ZOLPIDEM TARTRATE 5 MG PO TABS
5.0000 mg | ORAL_TABLET | Freq: Every evening | ORAL | Status: DC | PRN
Start: 2020-10-15 — End: 2020-10-17

## 2020-10-15 MED ORDER — FENTANYL CITRATE (PF) 100 MCG/2ML IJ SOLN
INTRAMUSCULAR | Status: AC
  Filled 2020-10-15: qty 2

## 2020-10-15 MED ORDER — NALOXONE HCL 0.4 MG/ML IJ SOLN: 0.4000 mg | INTRAMUSCULAR | Status: DC | PRN

## 2020-10-15 MED ORDER — SIMETHICONE 80 MG PO CHEW
80.0000 mg | CHEWABLE_TABLET | Freq: Three times a day (TID) | ORAL | Status: DC
  Administered 2020-10-15 – 2020-10-17 (×6): 80 mg via ORAL
  Filled 2020-10-15 (×5): qty 1

## 2020-10-15 MED ORDER — BUPIVACAINE HCL (PF) 0.5 % IJ SOLN
INTRAMUSCULAR | Status: DC | PRN
  Administered 2020-10-15: 20 mL

## 2020-10-15 MED ORDER — MENTHOL 3 MG MT LOZG
1.0000 | LOZENGE | OROMUCOSAL | Status: DC | PRN
  Filled 2020-10-15: qty 9

## 2020-10-15 MED ORDER — KETOROLAC TROMETHAMINE 30 MG/ML IJ SOLN: 30.0000 mg | Freq: Four times a day (QID) | INTRAMUSCULAR | Status: AC | PRN

## 2020-10-15 MED ORDER — NALBUPHINE HCL 10 MG/ML IJ SOLN: 5.0000 mg | INTRAMUSCULAR | Status: DC | PRN

## 2020-10-15 MED ORDER — NALOXONE HCL 4 MG/10ML IJ SOLN
1.0000 ug/kg/h | INTRAMUSCULAR | Status: DC | PRN
  Filled 2020-10-15: qty 5

## 2020-10-15 MED ORDER — DIPHENHYDRAMINE HCL 25 MG PO CAPS: 25.0000 mg | ORAL_CAPSULE | ORAL | Status: DC | PRN

## 2020-10-15 MED ORDER — NALBUPHINE HCL 10 MG/ML IJ SOLN: 5.0000 mg | Freq: Once | INTRAMUSCULAR | Status: DC | PRN

## 2020-10-15 MED ORDER — KETOROLAC TROMETHAMINE 30 MG/ML IJ SOLN
30.0000 mg | Freq: Four times a day (QID) | INTRAMUSCULAR | Status: AC
  Administered 2020-10-16 (×3): 30 mg via INTRAVENOUS
  Filled 2020-10-15 (×3): qty 1

## 2020-10-15 MED ORDER — OXYTOCIN-SODIUM CHLORIDE 30-0.9 UT/500ML-% IV SOLN: 2.5000 [IU]/h | INTRAVENOUS | Status: AC

## 2020-10-15 MED ORDER — MISOPROSTOL 200 MCG PO TABS
ORAL_TABLET | ORAL | Status: AC
  Filled 2020-10-15: qty 5

## 2020-10-15 MED ORDER — VARICELLA VIRUS VACCINE LIVE 1350 PFU/0.5ML IJ SUSR
0.5000 mL | Freq: Once | INTRAMUSCULAR | Status: AC
  Administered 2020-10-17: 0.5 mL via SUBCUTANEOUS
  Filled 2020-10-15 (×2): qty 0.5

## 2020-10-15 MED ORDER — MORPHINE SULFATE (PF) 0.5 MG/ML IJ SOLN
INTRAMUSCULAR | Status: DC | PRN
  Administered 2020-10-15: .1 mg via INTRATHECAL

## 2020-10-15 MED ORDER — BUPIVACAINE IN DEXTROSE 0.75-8.25 % IT SOLN
INTRATHECAL | Status: DC | PRN
  Administered 2020-10-15: 1.5 mL via INTRATHECAL

## 2020-10-15 MED ORDER — WITCH HAZEL-GLYCERIN EX PADS: 1.0000 "application " | MEDICATED_PAD | CUTANEOUS | Status: DC | PRN

## 2020-10-15 MED ORDER — BUPIVACAINE HCL (PF) 0.5 % IJ SOLN
INTRAMUSCULAR | Status: AC
  Filled 2020-10-15: qty 30

## 2020-10-15 MED ORDER — OXYTOCIN-SODIUM CHLORIDE 30-0.9 UT/500ML-% IV SOLN
INTRAVENOUS | Status: AC
  Filled 2020-10-15: qty 500

## 2020-10-15 MED ORDER — DIPHENHYDRAMINE HCL 50 MG/ML IJ SOLN: 12.5000 mg | INTRAMUSCULAR | Status: DC | PRN

## 2020-10-15 MED ORDER — CARBOPROST TROMETHAMINE 250 MCG/ML IM SOLN
INTRAMUSCULAR | Status: AC
  Filled 2020-10-15: qty 1

## 2020-10-15 MED ORDER — METHYLERGONOVINE MALEATE 0.2 MG/ML IJ SOLN
INTRAMUSCULAR | Status: AC
  Filled 2020-10-15: qty 1

## 2020-10-15 MED ORDER — SODIUM CHLORIDE 0.9% FLUSH: 3.0000 mL | INTRAVENOUS | Status: DC | PRN

## 2020-10-15 MED ORDER — BUPIVACAINE HCL 0.5 % IJ SOLN
10.0000 mL | Freq: Once | INTRAMUSCULAR | Status: DC
  Filled 2020-10-15: qty 10

## 2020-10-15 MED ORDER — FENTANYL CITRATE (PF) 100 MCG/2ML IJ SOLN
INTRAMUSCULAR | Status: DC | PRN
  Administered 2020-10-15: 15 ug via INTRATHECAL

## 2020-10-15 MED ORDER — SIMETHICONE 80 MG PO CHEW: 80.0000 mg | CHEWABLE_TABLET | ORAL | Status: DC | PRN

## 2020-10-15 MED ORDER — OXYCODONE-ACETAMINOPHEN 5-325 MG PO TABS: 1.0000 | ORAL_TABLET | ORAL | Status: DC | PRN

## 2020-10-15 MED ORDER — KETOROLAC TROMETHAMINE 30 MG/ML IJ SOLN
30.0000 mg | Freq: Four times a day (QID) | INTRAMUSCULAR | Status: AC | PRN
  Administered 2020-10-16 (×2): 30 mg via INTRAVENOUS

## 2020-10-15 MED ORDER — SODIUM CHLORIDE 0.9 % IV SOLN
INTRAVENOUS | Status: AC
  Administered 2020-10-15: 2 g via INTRAVENOUS
  Filled 2020-10-15: qty 2

## 2020-10-15 MED ORDER — SOD CITRATE-CITRIC ACID 500-334 MG/5ML PO SOLN
ORAL | Status: AC
  Administered 2020-10-15: 30 mL via ORAL
  Filled 2020-10-15: qty 15

## 2020-10-15 MED ORDER — DIBUCAINE (PERIANAL) 1 % EX OINT
1.0000 | TOPICAL_OINTMENT | CUTANEOUS | Status: DC | PRN
Start: 2020-10-15 — End: 2020-10-17

## 2020-10-15 MED ORDER — ACETAMINOPHEN 500 MG PO TABS
ORAL_TABLET | ORAL | Status: AC
  Administered 2020-10-15: 1000 mg via ORAL
  Filled 2020-10-15: qty 2

## 2020-10-15 MED ORDER — COCONUT OIL OIL: 1.0000 "application " | TOPICAL_OIL | Status: DC | PRN

## 2020-10-15 MED ORDER — DIPHENHYDRAMINE HCL 25 MG PO CAPS: 25.0000 mg | ORAL_CAPSULE | Freq: Four times a day (QID) | ORAL | Status: DC | PRN

## 2020-10-15 MED ORDER — ACETAMINOPHEN 325 MG PO TABS: 650.0000 mg | ORAL_TABLET | ORAL | Status: DC | PRN

## 2020-10-15 MED ORDER — SODIUM CHLORIDE 0.9 % IV SOLN: 2.0000 g | INTRAVENOUS | Status: AC

## 2020-10-15 MED ORDER — ONDANSETRON HCL 4 MG/2ML IJ SOLN: 4.0000 mg | Freq: Three times a day (TID) | INTRAMUSCULAR | Status: DC | PRN

## 2020-10-15 MED ORDER — MORPHINE SULFATE (PF) 0.5 MG/ML IJ SOLN
INTRAMUSCULAR | Status: AC
  Filled 2020-10-15: qty 10

## 2020-10-15 MED ORDER — ACETAMINOPHEN 500 MG PO TABS
1000.0000 mg | ORAL_TABLET | Freq: Four times a day (QID) | ORAL | Status: AC
  Administered 2020-10-16 (×3): 1000 mg via ORAL
  Filled 2020-10-15 (×3): qty 2

## 2020-10-15 MED ORDER — PRENATAL MULTIVITAMIN CH
1.0000 | ORAL_TABLET | Freq: Every day | ORAL | Status: DC
  Administered 2020-10-16 – 2020-10-17 (×2): 1 via ORAL
  Filled 2020-10-15 (×2): qty 1

## 2020-10-15 MED ORDER — OXYTOCIN-SODIUM CHLORIDE 30-0.9 UT/500ML-% IV SOLN
INTRAVENOUS | Status: DC | PRN
  Administered 2020-10-15: 500 mL via INTRAVENOUS

## 2020-10-15 MED ORDER — BUPIVACAINE 0.25 % ON-Q PUMP DUAL CATH 400 ML
400.0000 mL | INJECTION | Status: DC
  Filled 2020-10-15: qty 400

## 2020-10-15 MED ORDER — IBUPROFEN 800 MG PO TABS
800.0000 mg | ORAL_TABLET | Freq: Four times a day (QID) | ORAL | Status: DC
  Administered 2020-10-16 – 2020-10-17 (×4): 800 mg via ORAL
  Filled 2020-10-15 (×3): qty 1

## 2020-10-15 MED ORDER — SODIUM CHLORIDE 0.9 % IV SOLN
INTRAVENOUS | Status: DC | PRN
  Administered 2020-10-15: 30 ug/min via INTRAVENOUS

## 2020-10-15 MED ORDER — ONDANSETRON HCL 4 MG/2ML IJ SOLN
INTRAMUSCULAR | Status: DC | PRN
  Administered 2020-10-15: 4 mg via INTRAVENOUS

## 2020-10-15 SURGICAL SUPPLY — 28 items
CANISTER SUCT 3000ML PPV (MISCELLANEOUS) ×2 IMPLANT
CATH KIT ON-Q SILVERSOAK 5IN (CATHETERS) ×4 IMPLANT
CHLORAPREP W/TINT 26 (MISCELLANEOUS) ×4 IMPLANT
COVER WAND RF STERILE (DRAPES) ×2 IMPLANT
DERMABOND ADVANCED (GAUZE/BANDAGES/DRESSINGS) ×1
DERMABOND ADVANCED .7 DNX12 (GAUZE/BANDAGES/DRESSINGS) ×1 IMPLANT
DRSG OPSITE POSTOP 4X10 (GAUZE/BANDAGES/DRESSINGS) ×2 IMPLANT
DRSG TEGADERM 4X4.75 (GAUZE/BANDAGES/DRESSINGS) ×2 IMPLANT
ELECT CAUTERY BLADE 6.4 (BLADE) ×2 IMPLANT
ELECT REM PT RETURN 9FT ADLT (ELECTROSURGICAL) ×2
ELECTRODE REM PT RTRN 9FT ADLT (ELECTROSURGICAL) ×1 IMPLANT
GLOVE SKINSENSE NS SZ8.0 LF (GLOVE) ×1
GLOVE SKINSENSE STRL SZ8.0 LF (GLOVE) ×1 IMPLANT
GOWN STRL REUS W/ TWL LRG LVL3 (GOWN DISPOSABLE) ×1 IMPLANT
GOWN STRL REUS W/ TWL XL LVL3 (GOWN DISPOSABLE) ×2 IMPLANT
GOWN STRL REUS W/TWL LRG LVL3 (GOWN DISPOSABLE) ×1
GOWN STRL REUS W/TWL XL LVL3 (GOWN DISPOSABLE) ×2
MANIFOLD NEPTUNE II (INSTRUMENTS) ×2 IMPLANT
MAT PREVALON FULL STRYKER (MISCELLANEOUS) ×2 IMPLANT
NS IRRIG 1000ML POUR BTL (IV SOLUTION) ×2 IMPLANT
PACK C SECTION AR (MISCELLANEOUS) ×2 IMPLANT
PAD OB MATERNITY 4.3X12.25 (PERSONAL CARE ITEMS) ×2 IMPLANT
PAD PREP 24X41 OB/GYN DISP (PERSONAL CARE ITEMS) ×2 IMPLANT
PENCIL SMOKE EVACUATOR (MISCELLANEOUS) ×2 IMPLANT
SUT MAXON ABS #0 GS21 30IN (SUTURE) ×4 IMPLANT
SUT VIC AB 1 CT1 36 (SUTURE) ×8 IMPLANT
SUT VIC AB 2-0 CT1 36 (SUTURE) ×2 IMPLANT
SUT VIC AB 4-0 FS2 27 (SUTURE) ×2 IMPLANT

## 2020-10-15 NOTE — Anesthesia Procedure Notes (Signed)
Spinal  Patient location during procedure: OR Start time: 10/15/2020 2:53 PM End time: 10/15/2020 2:56 PM Reason for block: surgical anesthesia Staffing Performed: resident/CRNA  Anesthesiologist: Piscitello, Cleda Mccreedy, MD Resident/CRNA: Jeanine Luz, CRNA Preanesthetic Checklist Completed: patient identified, IV checked, site marked, risks and benefits discussed, surgical consent, monitors and equipment checked and pre-op evaluation Spinal Block Patient position: sitting Prep: ChloraPrep Patient monitoring: heart rate, continuous pulse ox and blood pressure Approach: midline Location: L3-4 Injection technique: single-shot Needle Needle type: Pencan  Needle gauge: 24 G Needle length: 10 cm Assessment Sensory level: T4 Events: CSF return

## 2020-10-15 NOTE — Transfer of Care (Signed)
Immediate Anesthesia Transfer of Care Note  Patient: Karen Benton  Procedure(s) Performed: CESAREAN SECTION  Patient Location: PACU and Mother/Baby  Anesthesia Type:Spinal  Level of Consciousness: awake, alert  and oriented  Airway & Oxygen Therapy: Patient Spontanous Breathing  Post-op Assessment: Report given to RN and Post -op Vital signs reviewed and stable  Post vital signs: Reviewed and stable  Last Vitals:  Vitals Value Taken Time  BP 112/60   Temp    Pulse 96   Resp 15   SpO2 99     Last Pain:  Vitals:   10/15/20 1324  TempSrc:   PainSc: 9       Patients Stated Pain Goal: 0 (10/15/20 1324)  Complications: No complications documented.

## 2020-10-15 NOTE — Progress Notes (Signed)
Labor Progress Note  Karen Benton is a 25 y.o. G1P0000 at [redacted]w[redacted]d by LMP (consistent with 14 wk [place]) admitted for induction of labor at term.  Subjective:  Patient is sitting up in bed - reports intermittent discomfort with contractions but overall epidural is allowing her to manage her pain well.  Objective: BP (!) 109/59   Pulse (!) 108   Temp 98.6 F (37 C) (Oral)   Resp 18   Ht 5\' 3"  (1.6 m)   Wt 110.2 kg   LMP 01/03/2020   SpO2 94%   BMI 43.05 kg/m   Fetal Assessment: FHT:  FHR: 145 bpm, variability: moderate,  accelerations:  Present,  decelerations:  Present intermittent variables noted Category/reactivity:  Category II UC:   regular, every 3-4 minutes - MVUs currently inadequate SVE:   Deferred  Membrane status: AROM Amniotic color: clear  Labs: Lab Results  Component Value Date   WBC 10.9 (H) 10/14/2020   HGB 10.8 (L) 10/14/2020   HCT 32.2 (L) 10/14/2020   MCV 86.1 10/14/2020   PLT 231 10/14/2020    Assessment / Plan: Induction of labor at term,  progressing well on pitocin  Labor: Continue to titrate pitocin, monitor MVUs Preeclampsia:  No S&S of PIH Fetal Wellbeing:  Category II  - will continue to monitor closely Pain Control:  Epidural - PCA bolus as indicated I/D:  GBS negative Anticipated MOD:  NSVD  10/16/2020, CNM 10/15/2020, 9:58 AM

## 2020-10-15 NOTE — Lactation Note (Signed)
This note was copied from a baby's chart. Lactation Consultation Note  Patient Name: Karen Benton [id number]'U Date: 10/15/2020 Reason for consult: L&D Initial assessment;1st time breastfeeding;Term;Other (Comment) (c-section) Age:24 hours  Lactation to L&D for initial assessment of first time mom. Mom had c-section <1hr ago to LGA infant. Baby already at breast with assistance from Transition RN.  LC provided BF education: newborn stomach size, feeding patterns/behaviors, early cues, output expectations. Encouraged to feed with cue, offer breast 8-12x in first 24 hours, and taught hand expression.  Baby came off breast and LC assisted with re-latching. A few attempts made before latch was sustained. LC showed mom how to sandwich the breast tissue for deeper latch, positioning and alignment of baby at the breast, and keeping baby awake with breast compression and massage.  Encouraged to call for BF support as needed.  Maternal Data Has patient been taught Hand Expression?: Yes Does the patient have breastfeeding experience prior to this delivery?: No  Feeding Mother's Current Feeding Choice: Breast Milk  LATCH Score Latch: Repeated attempts needed to sustain latch, nipple held in mouth throughout feeding, stimulation needed to elicit sucking reflex.  Audible Swallowing: A few with stimulation  Type of Nipple: Everted at rest and after stimulation  Comfort (Breast/Nipple): Soft / non-tender  Hold (Positioning): Assistance needed to correctly position infant at breast and maintain latch.  LATCH Score: 7   Lactation Tools Discussed/Used    Interventions Interventions: Breast feeding basics reviewed;Assisted with latch;Hand express;Breast massage;Breast compression;Adjust position;Support pillows;Education  Discharge    Consult Status Consult Status: Follow-up Date: 10/16/20 Follow-up type: In-patient    Danford Bad 10/15/2020, 4:36 PM

## 2020-10-15 NOTE — Progress Notes (Signed)
  Labor Progress Note   25 y.o. G1P0000 @ [redacted]w[redacted]d , admitted for  Pregnancy, Labor Management.   Subjective:  No change in cervix despite numerous maneuvers of positioning, and Pitocin w IUPC monitoring of ctx pattern  Objective:  BP 127/64 (BP Location: Right Arm)   Pulse (!) 103   Temp 98.3 F (36.8 C) (Oral)   Resp 18   Ht 5\' 3"  (1.6 m)   Wt 110.2 kg   LMP 01/03/2020   SpO2 94%   BMI 43.05 kg/m  Abd: gravid, ND, FHT present, mild tenderness on exam Extr: trace to 1+ bilateral pedal edema SVE: CERVIX: unchanged, edema to cervix, station still high  EFM: FHR: 140 bpm, variability: moderate,  accelerations:  Present,  decelerations:  Absent Toco: Frequency: Every 2-3 minutes Labs: I have reviewed the patient's lab results.   Assessment & Plan:  G1P0000 @ [redacted]w[redacted]d, admitted for  Pregnancy and Labor/Delivery Management  1. Pain management: epidural. 2. FWB: FHT category 1.  3. ID: GBS negative 4. Labor management: Protracted labor The risks of cesarean section discussed with the patient included but were not limited to: bleeding which may require transfusion or reoperation; infection which may require antibiotics; injury to bowel, bladder, ureters or other surrounding organs; injury to the fetus; need for additional procedures including hysterectomy in the event of a life-threatening hemorrhage; placental abnormalities wth subsequent pregnancies, incisional problems, thromboembolic phenomenon and other postoperative/anesthesia complications. The patient concurred with the proposed plan, giving informed written consent for the procedure.   All discussed with patient, see orders  [redacted]w[redacted]d, MD, Annamarie Major Ob/Gyn, [facility] 10/15/2020  2:25 PM

## 2020-10-15 NOTE — Op Note (Signed)
Cesarean Section Procedure Note  Indications: cephalo-pelvic disproportion, failed induction, failure to progress: arrest of dilation and term intrauterine pregnancy  Pre-operative Diagnosis: cephalo-pelvic disproportion, failed induction, failure to progress: arrest of dilation and term intrauterine pregnancy (40 weeks) Post-operative Diagnosis: same, delivered.  Large for gestational age. Procedure: Low Transverse Cesarean Section Surgeon: Annamarie Major, MD, FACOG Assistant(s): Dr Onalee Hua, No other capable assistant available, in surgery requiring high level assistant. Anesthesia: Spinal anesthesia Estimated Blood Loss:400 Complications: None; patient tolerated the procedure well. Disposition: PACU - hemodynamically stable. Condition: stable  Findings: A female infant in the cephalic presentation.  "Roman" Amniotic fluid - Clear  Birth weight 9 lbs 5 oz.  Apgars of 9 and 9.  Intact placenta with a three-vessel cord. Grossly normal uterus, tubes and ovaries bilaterally. No intraabdominal adhesions were noted.  Procedure Details   The patient was taken to Operating Room, identified as the correct patient and the procedure verified as C-Section Delivery. A Time Out was held and the above information confirmed. After induction of anesthesia, the patient was draped and prepped in the usual sterile manner. A Pfannenstiel incision was made and carried down through the subcutaneous tissue to the fascia. Fascial incision was made and extended transversely with the Mayo scissors. The fascia was separated from the underlying rectus tissue superiorly and inferiorly. The peritoneum was identified and entered bluntly. Peritoneal incision was extended longitudinally. The utero-vesical peritoneal reflection was incised transversely and a bladder flap was created digitally.  A low transverse hysterotomy was made. The fetus was delivered atraumatically. The umbilical cord was clamped x2 and cut and the  infant was handed to the awaiting pediatricians. The placenta was removed intact and appeared normal with a 3-vessel cord.  The uterus was exteriorized and cleared of all clot and debris. The hysterotomy was closed with running sutures of 0 Vicryl suture. A second imbricating layer was placed with the same suture. Excellent hemostasis was observed. The uterus was returned to the abdomen. The pelvis was irrigated and again, excellent hemostasis was noted.  The On Q Pain pump System was then placed.  Trocars were placed through the abdominal wall into the subfascial space and these were used to thread the silver soaker cathaters into place.The rectus fascia was then reapproximated with running sutures of Maxon, with careful placement not to incorporate the cathaters. Subcutaneous tissues are then irrigated with saline and hemostasis assured.  Skin is then closed with 4-0 vicryl suture in a subcuticular fashion followed by skin adhesive.  The surgical assistant performed tissue retraction, assistance with suturing, and fundal pressure.   The cathaters are flushed each with 5 mL of Bupivicaine and stabilized into place with dressing. Instrument, sponge, and needle counts were correct prior to the abdominal closure and at the conclusion of the case.  The patient tolerated the procedure well and was transferred to the recovery room in stable condition.   Annamarie Major, MD, [facility], [facility] 10/15/2020  3:48 PM

## 2020-10-15 NOTE — Progress Notes (Signed)
Karen Benton is a 25 y.o. G1P0000 at [redacted]w[redacted]d by ultrasound admitted for induction of labor due to Post dates. Due date 10/09/2020..  Subjective:  Comfortable. She denies any dizziness. Her BPs have returned to normal range. She is tired, but denies pain.  Objective: BP 120/60   Pulse (!) 108   Temp 98.1 F (36.7 C) (Oral)   Resp 18   Ht 5\' 3"  (1.6 m)   Wt 110.2 kg   LMP 01/03/2020   SpO2 94%   BMI 43.05 kg/m  I/O last 3 completed shifts: In: 6324.7 [P.O.:120; I.V.:5204.7; IV Piggyback:1000] Out: 1000 [Urine:1000] No intake/output data recorded.  FHT:  FHR: 135 baseline bpm, variability: moderate,  accelerations:  Present,  decelerations:  Absent UC:   regular, every 3  minutes SVE:   Dilation: 6 Effacement (%): 90 Station: -2 Exam by:: [email or web address] CNM  Pitocin at 4 mu/min  Labs: Lab Results  Component Value Date   WBC 10.9 (H) 10/14/2020   HGB 10.8 (L) 10/14/2020   HCT 32.2 (L) 10/14/2020   MCV 86.1 10/14/2020   PLT 231 10/14/2020    Assessment / Plan: Induction of labor due to postterm,  progressing well on pitocin  Labor: Progressing normally  Fetal Wellbeing:  Category I Pain Control:  Epidural I/D:  n/a Anticipated MOD:  NSVD Caput noted with exam. Suspect LGA baby. Will continue to advance pitocin. IUPC placed and aim for  200 MVUs to achieve adequate uc strength.  10/16/2020 10/15/2020, 8:04 AM

## 2020-10-15 NOTE — Discharge Summary (Signed)
Postpartum Discharge Summary  Date of Service updated 10/17/2020     Patient Name: Karen Benton DOB: [birth year] MRN: [id number]  Date of admission: 10/14/2020 Delivery date:10/15/2020  Delivering provider: Gae Dry  Date of discharge: 10/17/2020  Admitting diagnosis: Encounter for elective induction of labor [Z34.90] Intrauterine pregnancy: [redacted]w[redacted]d    Secondary diagnosis:  Principal Problem:   Failure to progress in labor Active Problems:   Encounter for elective induction of labor   [redacted] weeks gestation of pregnancy   Born by cesarean section   Postpartum care following cesarean delivery  Additional problems: None    Discharge diagnosis: Term Pregnancy Delivered                                              Post partum procedures:NA Augmentation: AROM, Pitocin and Cytotec Complications: None  Hospital course: Induction of Labor With Cesarean Section   25y.o. yo G1P1001 at 444w6das admitted to the hospital 10/14/2020 for induction of labor. Patient had a labor course significant for progression to about 8 cm but no further; concern for LGA.. The patient went for cesarean section due to Arrest of Dilation. Delivery details are as follows: Membrane Rupture Time/Date: 6:13 PM ,10/14/2020   Delivery Method:C-Section, Low Transverse  Details of operation can be found in separate operative Note.  Patient had an uncomplicated postpartum course. She is ambulating, tolerating a regular diet, passing flatus, and urinating well.  Patient is discharged home in stable condition on 10/17/20.      Newborn Data: Birth date:10/15/2020  Birth time:3:08 PM  Gender:Female  Living status:Living  Apgars:9 ,9  Weight:4230 g                                 Magnesium Sulfate received: No BMZ received: No Rhophylac:No MMR:No T-DaP:Given prenatally Flu: Yes Transfusion:No  Physical exam  Vitals:   10/16/20 1208 10/16/20 1602 10/16/20 2329 10/17/20 0804  BP: 102/68 109/66 115/74  108/65  Pulse: 98 89 (!) 106 89  Resp: 20 18 18 18   Temp: 98 F (36.7 C) 98.1 F (36.7 C) 98.3 F (36.8 C) (!) 97.5 F (36.4 C)  TempSrc: Oral Oral    SpO2:   100% 98%  Weight:      Height:       General: alert, cooperative and no distress Lochia: appropriate Uterine Fundus: firm Incision: Healing well with no significant drainage, Dressing is clean, dry, and intact, On Q is in place DVT Evaluation: No evidence of DVT seen on physical exam. Negative Homan's sign. Calf/Ankle edema is present Labs: Lab Results  Component Value Date   WBC 15.8 (H) 10/16/2020   HGB 10.6 (L) 10/16/2020   HCT 32.4 (L) 10/16/2020   MCV 87.8 10/16/2020   PLT 208 10/16/2020   CMP Latest Ref Rng & Units 04/01/2020  Glucose 70 - 99 mg/dL 126(H)  BUN 6 - 20 mg/dL 10  Creatinine 0.44 - 1.00 mg/dL 0.57  Sodium 135 - 145 mmol/L 134(L)  Potassium 3.5 - 5.1 mmol/L 3.7  Chloride 98 - 111 mmol/L 104  CO2 22 - 32 mmol/L 22  Calcium 8.9 - 10.3 mg/dL 8.8(L)  Total Protein 6.5 - 8.1 g/dL 6.6  Total Bilirubin 0.3 - 1.2 mg/dL 0.6  Alkaline Phos 38 - 126  U/L 26(L)  AST 15 - 41 U/L 21  ALT 0 - 44 U/L 17   Edinburgh Score: No flowsheet data found.    After visit meds:  Allergies as of 10/17/2020   No Known Allergies     Medication List    TAKE these medications   ibuprofen 800 MG tablet Commonly known as: ADVIL Take 1 tablet (800 mg total) by mouth every 6 (six) hours.   oxyCODONE-acetaminophen 5-325 MG tablet Commonly known as: PERCOCET/ROXICET Take 1-2 tablets by mouth every 4 (four) hours as needed for moderate pain.            Discharge Care Instructions  (From admission, onward)         Start     Ordered   10/17/20 0000  Leave dressing on - Keep it clean, dry, and intact until clinic visit        10/17/20 1228           Discharge home in stable condition Infant Feeding: Breast Infant Disposition:home with mother Discharge instruction: per After Visit Summary and Postpartum  booklet. Activity: Advance as tolerated. Pelvic rest for 6 weeks.  Diet: routine diet Anticipated Birth Control: Condoms Postpartum Appointment:6 weeks Additional Postpartum F/U: Incision check 1 week Future Appointments:No future appointments. Follow up Visit:  Follow-up Information    Gae Dry, MD. Schedule an appointment as soon as possible for a visit in 1 week(s).   Specialty: Obstetrics and Gynecology Why: Please call to schedule an appointment for 1 week for a Post Op check Contact information: [address] [place] [address] [phone]                   10/17/2020 Imagene Riches, CNM

## 2020-10-15 NOTE — Progress Notes (Signed)
  Labor Progress Note   25 y.o. G1P0000 @ [redacted]w[redacted]d , admitted for  Pregnancy, Labor Management.   Subjective:  No pain w epidural. Position changes for labor management Pitocin for ctx's  Objective:  BP (!) 109/59   Pulse (!) 108   Temp 98.6 F (37 C) (Oral)   Resp 18   Ht 5\' 3"  (1.6 m)   Wt 110.2 kg   LMP 01/03/2020   SpO2 94%   BMI 43.05 kg/m  Abd: gravid, ND, FHT present, mild tenderness on exam Extr: trace to 1+ bilateral pedal edema SVE: CERVIX: 8 cm dilated, 80 effaced (ant cervix edema), -2 station, presenting part Vtx  EFM: FHR: 140 bpm, variability: moderate,  accelerations:  Present,  decelerations:  Absent Toco: Frequency: Every 2-3 minutes, mVu <180 Labs: I have reviewed the patient's lab results.   Assessment & Plan:  G1P0000 @ [redacted]w[redacted]d, admitted for  Pregnancy and Labor/Delivery Management  1. Pain management: epidural. 2. FWB: FHT category 1.  3. ID: GBS negative 4. Labor management: Discussed protracted labor and its risks, will labor further w Pitocin titration and consider options for CS if no change after next exam.  She has been around this 6-8 cm (based on examiner), but fetal well being reassuring and contractions not fully adequate by mVu monitoring, so will give more time for hopeful vag delivery  All discussed with patient, see orders  [redacted]w[redacted]d, MD, Annamarie Major Ob/Gyn, [facility] 10/15/2020  10:42 AM

## 2020-10-16 ENCOUNTER — Encounter: Payer: Self-pay | Admitting: Obstetrics & Gynecology

## 2020-10-16 LAB — CBC
HCT: 32.4 % — ABNORMAL LOW (ref 36.0–46.0)
Hemoglobin: 10.6 g/dL — ABNORMAL LOW (ref 12.0–15.0)
MCH: 28.7 pg (ref 26.0–34.0)
MCHC: 32.7 g/dL (ref 30.0–36.0)
MCV: 87.8 fL (ref 80.0–100.0)
Platelets: 208 10*3/uL (ref 150–400)
RBC: 3.69 MIL/uL — ABNORMAL LOW (ref 3.87–5.11)
RDW: 14.9 % (ref 11.5–15.5)
WBC: 15.8 10*3/uL — ABNORMAL HIGH (ref 4.0–10.5)
nRBC: 0 % (ref 0.0–0.2)

## 2020-10-16 NOTE — Progress Notes (Signed)
Obstetric Postpartum/PostOperative Daily Progress Note Subjective:  25 y.o. G1P1001 post-operative day # 1 status post primary cesarean delivery.  She is ambulating, is tolerating po, is not yet voiding spontaneously.  Her pain is well controlled on PO pain medications. Her lochia is less than menses. She reports good latch so far with breastfeeding and has made additional attempts although baby has been sleepy.   Medications SCHEDULED MEDICATIONS  . acetaminophen  1,000 mg Oral Q6H  . bupivacaine  10 mL Infiltration Once  . ketorolac  30 mg Intravenous Q6H   Followed by  . ibuprofen  800 mg Oral Q6H  . prenatal multivitamin  1 tablet Oral Q1200  . senna-docusate  2 tablet Oral Q24H  . simethicone  80 mg Oral TID PC  . varicella virus vaccine live  0.5 mL Subcutaneous Once    MEDICATION INFUSIONS  . bupivacaine 0.25 % ON-Q pump DUAL CATH 400 mL    . bupivacaine ON-Q pain pump    . lactated ringers    . naLOXone [facility] adult infusion for PRURITIS      PRN MEDICATIONS  acetaminophen, coconut oil, witch hazel-glycerin **AND** dibucaine, diphenhydrAMINE, diphenhydrAMINE **OR** diphenhydrAMINE, ketorolac **OR** ketorolac, menthol-cetylpyridinium, morphine injection, nalbuphine **OR** nalbuphine, nalbuphine **OR** nalbuphine, naloxone **AND** sodium chloride flush, naLOXone (NARCAN) adult infusion for PRURITIS, ondansetron (ZOFRAN) IV, oxyCODONE-acetaminophen, simethicone, zolpidem    Objective:   Vitals:   10/16/20 0425 10/16/20 0500 10/16/20 0800 10/16/20 0804  BP: 102/63   99/72  Pulse: 88 88 (!) 101 99  Resp: 20   20  Temp: 98.1 F (36.7 C)   98.6 F (37 C)  TempSrc: Oral   Oral  SpO2: 95% 94% 92% 96%  Weight:      Height:        Current Vital Signs 24h Vital Sign Ranges  T 98.6 F (37 C) Temp  Avg: 98.5 F (36.9 C)  Min: 98.1 F (36.7 C)  Max: 99 F (37.2 C)  BP 99/72 BP  Min: 93/47  Max: 137/71  HR 99 Pulse  Avg: 96  Min: 84  Max: 108  RR 20 Resp  Avg: 18.2  Min: 0   Max: 34  SaO2 96 % Room Air SpO2  Avg: 94.4 %  Min: 92 %  Max: 98 %       24 Hour I/O Current Shift I/O  Time Ins Outs 03/24 0701 - 03/25 0700 In: 700 [I.V.:700] Out: 3085 [Urine:2175] No intake/output data recorded.  General: NAD Pulmonary: no increased work of breathing Abdomen: non-distended, non-tender, fundus firm at level of umbilicus Inc: Clean/dry/intact Extremities: no edema, no erythema, no tenderness  Labs:  Recent Labs  Lab 10/14/20 0551 10/16/20 0421  WBC 10.9* 15.8*  HGB 10.8* 10.6*  HCT 32.2* 32.4*  PLT 231 208     Assessment:   25 y.o. G1P1001 postoperative day # 1 status post primary cesarean section, lactating  Plan:  1) Acute blood loss anemia - hemodynamically stable and asymptomatic - po ferrous sulfate  2) A POS / Rubella 2.77 (08/12 1437)/ Varicella Not immune  3) TDAP status given antepartum  4) breast /Contraception = condoms  5) Disposition: continue current care   Tresea Mall, CNM 10/16/2020 10:03 AM

## 2020-10-16 NOTE — Lactation Note (Signed)
This note was copied from a baby's chart. Lactation Consultation Note  Patient Name: Karen Benton QQIWL'N Date: 10/16/2020 Reason for consult: Follow-up assessment;Primapara Age:24 hours  Maternal Data    Feeding Mother's Current Feeding Choice: Breast Milk Baby has not fed in 4+hrs, very sleepy, attempted in cradle hold and then in football hold, able to latch after hand expression and massage, sandwiching breast and getting deep latch after two attempts, needed stimulation at first to continue nursing, but then began a more consistent sucking pattern, occ swallows noted , mom encouraged to offer breast every 2-3 hrs or at least 8 x in 24 hrs., call for assistance in waking baby if she is unable to do so.       LATCH Score Latch: Repeated attempts needed to sustain latch, nipple held in mouth throughout feeding, stimulation needed to elicit sucking reflex.  Audible Swallowing: A few with stimulation  Type of Nipple: Everted at rest and after stimulation  Comfort (Breast/Nipple): Soft / non-tender  Hold (Positioning): Assistance needed to correctly position infant at breast and maintain latch.  LATCH Score: 7   Lactation Tools Discussed/Used    Interventions Interventions: Assisted with latch;Skin to skin;Hand express  Discharge    Consult Status Consult Status: Follow-up Date: 10/17/20 Follow-up type: In-patient    Dyann Kief 10/16/2020, 6:29 PM

## 2020-10-16 NOTE — Lactation Note (Signed)
This note was copied from a baby's chart. Lactation Consultation Note  Patient Name: Karen Benton TCYEL'Y Date: 10/16/2020 Reason for consult: Follow-up assessment;Primapara Age:24 hours  Maternal Data Has patient been taught Hand Expression?: Yes Does the patient have breastfeeding experience prior to this delivery?: No  Feeding Mother's Current Feeding Choice: Breast Milk  LATCH Score Latch: Repeated attempts needed to sustain latch, nipple held in mouth throughout feeding, stimulation needed to elicit sucking reflex.  Audible Swallowing: A few with stimulation  Type of Nipple: Everted at rest and after stimulation  Comfort (Breast/Nipple): Soft / non-tender  Hold (Positioning): Assistance needed to correctly position infant at breast and maintain latch.  LATCH Score: 7   Lactation Tools Discussed/Used    Interventions Interventions: Breast feeding basics reviewed;Assisted with latch;Skin to skin;Hand express;Breast compression;Adjust position  Discharge Pump: Personal WIC Program: No  Consult Status Consult Status: Follow-up Date: 10/16/20 Follow-up type: In-patient    Dyann Kief 10/16/2020, 11:04 AM

## 2020-10-16 NOTE — Plan of Care (Signed)
  Problem: Education: Goal: Knowledge of General Education information will improve Description: Including pain rating scale, medication(s)/side effects and non-pharmacologic comfort measures 10/16/2020 0112 by Trellis Moment, RN Outcome: Progressing 10/16/2020 0112 by Trellis Moment, RN Outcome: Progressing   Problem: Health Behavior/Discharge Planning: Goal: Ability to manage health-related needs will improve Outcome: Progressing   Problem: Clinical Measurements: Goal: Will remain free from infection Outcome: Progressing Goal: Diagnostic test results will improve Outcome: Progressing Goal: Respiratory complications will improve Outcome: Progressing Goal: Cardiovascular complication will be avoided Outcome: Progressing   Problem: Activity: Goal: Risk for activity intolerance will decrease Outcome: Progressing   Problem: Nutrition: Goal: Adequate nutrition will be maintained Outcome: Progressing   Problem: Coping: Goal: Level of anxiety will decrease Outcome: Progressing   Problem: Elimination: Goal: Will not experience complications related to bowel motility Outcome: Progressing   Problem: Pain Managment: Goal: General experience of comfort will improve Outcome: Progressing   Problem: Safety: Goal: Ability to remain free from injury will improve Outcome: Progressing   Problem: Skin Integrity: Goal: Risk for impaired skin integrity will decrease Outcome: Progressing   Problem: Activity: Goal: Will verbalize the importance of balancing activity with adequate rest periods Outcome: Progressing   Problem: Coping: Goal: Ability to identify and utilize available resources and services will improve Outcome: Progressing   Problem: Role Relationship: Goal: Ability to demonstrate positive interaction with newborn will improve Outcome: Progressing

## 2020-10-17 MED ORDER — OXYCODONE-ACETAMINOPHEN 5-325 MG PO TABS: 1.0000 | ORAL_TABLET | ORAL | 0 refills | Status: DC | PRN

## 2020-10-17 MED ORDER — IBUPROFEN 800 MG PO TABS: 800.0000 mg | ORAL_TABLET | Freq: Four times a day (QID) | ORAL | 0 refills | Status: DC

## 2020-10-17 NOTE — Progress Notes (Signed)
Pt discharged with infant. Discharge instructions, prescriptions, and follow up appointments given to and reviewed with patient. Pt verbalized understanding. To be escorted out by auxillary.  °

## 2020-10-17 NOTE — Anesthesia Post-op Follow-up Note (Signed)
  Anesthesia Pain Follow-up Note  Patient: Karen Benton  Day #: 2  Date of Follow-up: 10/17/2020 Time: 12:40 PM  Last Vitals:  Vitals:   10/16/20 2329 10/17/20 0804  BP: 115/74 108/65  Pulse: (!) 106 89  Resp: 18 18  Temp: 36.8 C (!) 36.4 C  SpO2: 100% 98%    Level of Consciousness: alert  Pain: mild, back stiffness   Side Effects:None  Catheter Site Exam:clean, dry     Plan: D/C from anesthesia care at surgeon's request  Lenard Simmer

## 2020-10-17 NOTE — Anesthesia Postprocedure Evaluation (Signed)
Anesthesia Post Note  Patient: Karen Benton  Procedure(s) Performed: CESAREAN SECTION  Patient location during evaluation: Mother Baby Anesthesia Type: Epidural Level of consciousness: awake and alert Pain management: pain level controlled Vital Signs Assessment: post-procedure vital signs reviewed and stable Respiratory status: spontaneous breathing, nonlabored ventilation and respiratory function stable Cardiovascular status: stable Postop Assessment: no headache, able to ambulate and adequate PO intake Anesthetic complications: no   No complications documented.   Last Vitals:  Vitals:   10/16/20 2329 10/17/20 0804  BP: 115/74 108/65  Pulse: (!) 106 89  Resp: 18 18  Temp: 36.8 C (!) 36.4 C  SpO2: 100% 98%    Last Pain:  Vitals:   10/17/20 0623  TempSrc:   PainSc: 0-No pain                 Lenard Simmer

## 2020-10-17 NOTE — Discharge Instructions (Signed)
Cesarean Delivery, Care After This sheet gives you information about how to care for yourself after your procedure. Your health care provider may also give you more specific instructions. If you have problems or questions, contact your health care provider. What can I expect after the procedure? After the procedure, it is common to have:  A small amount of blood or clear fluid coming from the incision.  Some redness, swelling, and pain in your incision area.  Some abdominal pain and soreness.  Vaginal bleeding (lochia). Even though you did not have a vaginal delivery, you will still have vaginal bleeding and discharge.  Pelvic cramps.  Fatigue. You may have pain, swelling, and discomfort in the tissue between your vagina and your anus (perineum) if:  Your C-section was unplanned, and you were allowed to labor and push.  An incision was made in the area (episiotomy) or the tissue tore during attempted vaginal delivery. Follow these instructions at home: Incision care  Follow instructions from your health care provider about how to take care of your incision. Make sure you: ? Wash your hands with soap and water before you change your bandage (dressing). If soap and water are not available, use hand sanitizer. ? If you have a dressing, change it or remove it as told by your health care provider. ? Leave stitches (sutures), skin staples, skin glue, or adhesive strips in place. These skin closures may need to stay in place for 2 weeks or longer. If adhesive strip edges start to loosen and curl up, you may trim the loose edges. Do not remove adhesive strips completely unless your health care provider tells you to do that.  Check your incision area every day for signs of infection. Check for: ? More redness, swelling, or pain. ? More fluid or blood. ? Warmth. ? Pus or a bad smell.  Do not take baths, swim, or use a hot tub until your health care provider says it's okay. Ask your health  care provider if you can take showers.  When you cough or sneeze, hug a pillow. This helps with pain and decreases the chance of your incision opening up (dehiscing). Do this until your incision heals.   Medicines  Take over-the-counter and prescription medicines only as told by your health care provider.  If you were prescribed an antibiotic medicine, take it as told by your health care provider. Do not stop taking the antibiotic even if you start to feel better.  Do not drive or use heavy machinery while taking prescription pain medicine. Lifestyle  Do not drink alcohol. This is especially important if you are breastfeeding or taking pain medicine.  Do not use any products that contain nicotine or tobacco, such as cigarettes, e-cigarettes, and chewing tobacco. If you need help quitting, ask your health care provider. Eating and drinking  Drink at least 8 eight-ounce glasses of water every day unless told not to by your health care provider. If you breastfeed, you may need to drink even more water.  Eat high-fiber foods every day. These foods may help prevent or relieve constipation. High-fiber foods include: ? Whole grain cereals and breads. ? Brown rice. ? Beans. ? Fresh fruits and vegetables. Activity  If possible, have someone help you care for your baby and help with household activities for at least a few days after you leave the hospital.  Return to your normal activities as told by your health care provider. Ask your health care provider what activities are safe for   you.  Rest as much as possible. Try to rest or take a nap while your baby is sleeping.  Do not lift anything that is heavier than 10 lbs (4.5 kg), or the limit that you were told, until your health care provider says that it is safe.  Talk with your health care provider about when you can engage in sexual activity. This may depend on your: ? Risk of infection. ? How fast you heal. ? Comfort and desire to  engage in sexual activity.   General instructions  Do not use tampons or douches until your health care provider approves.  Wear loose, comfortable clothing and a supportive and well-fitting bra.  Keep your perineum clean and dry. Wipe from front to back when you use the toilet.  If you pass a blood clot, save it and call your health care provider to discuss. Do not flush blood clots down the toilet before you get instructions from your health care provider.  Keep all follow-up visits for you and your baby as told by your health care provider. This is important. Contact a health care provider if:  You have: ? A fever. ? Bad-smelling vaginal discharge. ? Pus or a bad smell coming from your incision. ? Difficulty or pain when urinating. ? A sudden increase or decrease in the frequency of your bowel movements. ? More redness, swelling, or pain around your incision. ? More fluid or blood coming from your incision. ? A rash. ? Nausea. ? Little or no interest in activities you used to enjoy. ? Questions about caring for yourself or your baby.  Your incision feels warm to the touch.  Your breasts turn red or become painful or hard.  You feel unusually sad or worried.  You vomit.  You pass a blood clot from your vagina.  You urinate more than usual.  You are dizzy or light-headed. Get help right away if:  You have: ? Pain that does not go away or get better with medicine. ? Chest pain. ? Difficulty breathing. ? Blurred vision or spots in your vision. ? Thoughts about hurting yourself or your baby. ? New pain in your abdomen or in one of your legs. ? A severe headache.  You faint.  You bleed from your vagina so much that you fill more than one sanitary pad in one hour. Bleeding should not be heavier than your heaviest period. Summary  After the procedure, it is common to have pain at your incision site, abdominal cramping, and slight bleeding from your vagina.  Check  your incision area every day for signs of infection.  Tell your health care provider about any unusual symptoms.  Keep all follow-up visits for you and your baby as told by your health care provider. This information is not intended to replace advice given to you by your health care provider. Make sure you discuss any questions you have with your health care provider. Document Revised: 01/17/2018 Document Reviewed: 01/17/2018 Elsevier Patient Education  2021 [employer]   Postpartum Baby Blues The postpartum period begins right after the birth of a baby. During this time, there is often joy and excitement. It is also a time of many changes in the life of the parents. A mother may feel happy one minute and sad or stressed the next. These feelings of sadness, called the baby blues, usually happen in the period right after the baby is born and go away within a week or two. What are the causes?  The exact cause of this condition is not known. Changes in hormone levels after childbirth are believed to trigger some of the symptoms. Other factors that can play a role in these mood changes include:  Lack of sleep.  Stressful life events, such as financial problems, caring for a loved one, or death of a loved one.  Genetics. What are the signs or symptoms? Symptoms of this condition include:  Changes in mood, such as going from extreme happiness to sadness.  A decrease in concentration.  Difficulty sleeping.  Crying spells and tearfulness.  Loss of appetite.  Irritability.  Anxiety. If these symptoms last for more than 2 weeks or become more severe, you may have postpartum depression. How is this diagnosed? This condition is diagnosed based on an evaluation of your symptoms. Your health care provider may use a screening tool that includes a list of questions to help identify a person with the baby blues or postpartum depression. How is this treated? The baby blues usually go away on  their own in 1-2 weeks. Social support is often what is needed. You will be encouraged to get adequate sleep and rest. Follow these instructions at home: Lifestyle  Get as much rest as you can. Take a nap when the baby sleeps.  Exercise regularly as told by your health care provider. Some women find yoga and walking to be helpful.  Eat a balanced and nourishing diet. This includes plenty of fruits and vegetables, whole grains, and lean proteins.  Do little things that you enjoy. Take a bubble bath, read your favorite magazine, or listen to your favorite music.  Avoid alcohol.  Ask for help with household chores, cooking, grocery shopping, or running errands. Do not try to do everything yourself. Consider hiring a postpartum doula to help. This is a professional who specializes in providing support to new mothers.  Try not to make any major life changes during pregnancy or right after giving birth. This can add stress.      General instructions  Talk to people close to you about how you are feeling. Get support from your partner, family members, friends, or other new moms. You may want to join a support group.  Find ways to manage stress. This may include: ? Writing your thoughts and feelings in a journal. ? Spending time outside. ? Spending time with people who make you laugh.  Try to stay positive in how you think. Think about the things you are grateful for.  Take over-the-counter and prescription medicines only as told by your health care provider.  Let your health care provider know if you have any concerns.  Keep all postpartum visits. This is important. Contact a health care provider if:  Your baby blues do not go away after 2 weeks. Get help right away if:  You have thoughts of taking your own life (suicidal thoughts), or of harming your baby or someone else.  You see or hear things that are not there (hallucinations). If you ever feel like you may hurt yourself or  others, or have thoughts about taking your own life, get help right away. Go to your nearest emergency department or:  Call your local emergency services (911 in the [place]  Call a suicide crisis helpline, such as the National Suicide Prevention Lifeline, at (662)860-1235. This is open 24 hours a day in the [place]  Text the Crisis Text Line at 902-756-3504 (in the [place] Summary  After giving birth, you may feel happy one  minute and sad or stressed the next. Feelings of sadness that happen right after the baby is born and go away after a week or two are called the baby blues.  You can manage the baby blues by getting enough rest, eating a healthy diet, exercising, spending time with supportive people, and finding ways to manage stress.  If feelings of sadness and stress last longer than 2 weeks or get in the way of caring for your baby, talk with your health care provider. This may mean you have postpartum depression. This information is not intended to replace advice given to you by your health care provider. Make sure you discuss any questions you have with your health care provider. Document Revised: 01/03/2020 Document Reviewed: 01/03/2020 Elsevier Patient Education  2021 [employer]  Breastfeeding Tips for a Good Latch Latching is how your baby's mouth attaches to your nipple to breastfeed. It is an important part of breastfeeding. Your baby may have trouble latching for a number of reasons, such as:  Not being in the right position.  Using a bottle or pacifier too early.  Problems within your baby's mouth, tongue, or lips.  The shape of your nipples.  Your baby being born early (prematurely). Small babies often have a weak suck.  Breasts becoming overfilled with milk (engorged breasts).  Express a little milk to help soften the breast. Work with a breastfeeding specialist (Advertising copywriter) to help your baby have a good latch. How does this affect me? A poor latch may cause  you to have problems such as:  Cracked nipples.  Sore nipples.  Breasts becoming overfilled with milk  Plugged milk ducts.  Low milk supply.  Breast inflammation.  Breast infection. How does this affect my baby? A poor latch may cause your baby to not be able to feed well. As a result, he or she may have trouble gaining weight. Follow these instructions at home: How to position your baby  Find a comfortable place to sit or lie down. Your neck and back should be well supported.  If you are seated, place a pillow or rolled-up blanket under your baby. This will bring him or her to the level of your breast.  Make sure that your baby's belly is facing your belly.  Try different positions to find one that works best for you and your baby. How to help your baby latch  To start, you might find it helpful to gently rub your breast. Move your fingertips in a circle as you massage from your chest wall toward your nipple. This helps milk flow. Keep doing this during feeding if needed.  Position your breast. Hold your breast with four fingers underneath and your thumb above your nipple. Keep your fingers away from your nipple and your baby's mouth. Follow these steps to help your baby latch: 1. Rub your baby's lips gently with your finger or nipple. 2. When your baby's mouth is open wide enough, quickly bring your baby to your breast and place your whole nipple into your baby's mouth. Place as much of the colored area around your nipple (areola)as possible into your baby's mouth. 3. Your baby's tongue should be between his or her lower gum and your breast. 4. You should be able to see more areola above your baby's upper lip than below the lower lip. 5. When your baby starts sucking, you will feel a gentle pull on your nipple. You should not feel any pain. Be patient. It is common for  a baby to suck for about 2-3 minutes to start the flow of breast milk. 6. Make sure that your baby's mouth is  in the right position around your nipple. Your baby's lips should make a seal on your breast and be turned outward.   General instructions  Look for these signs that your baby has latched on to your nipple: ? The baby is quietly tugging or sucking without causing you pain. ? You hear the baby swallow after every 3 or 4 sucks. ? You see movement above and in front of the baby's ears while he or she is sucking.  Be aware of these signs that your baby has not latched on to your nipple: ? The baby makes sucking sounds or smacking sounds while feeding. ? You have nipple pain.  If your baby is not latched well, put your little finger between your baby's gums and your nipple. This will break the seal. Then try to help your baby latch again.  If you need help, get help from a breastfeeding specialist. Contact a doctor if:  You have cracking or soreness in your nipples that lasts longer than 1 week.  You have nipple pain.  Your breasts are filled with too much milk (engorgement), and this does not improve after 48-72 hours.  You have a plugged milk duct and a fever.  You follow the tips for a good latch but need more help.  You have a pus-like fluid coming from your breast.  Your baby is not gaining weight.  Your baby loses weight. Summary  Latching is how your baby's mouth attaches to your nipple to breastfeed.  Try different positions for breastfeeding to find one that works best for you and your baby.  A poor latch may cause you to have cracked or sore nipples or other problems.  Work with a breastfeeding specialist (Advertising copywriter) to help your baby have a good latch. This information is not intended to replace advice given to you by your health care provider. Make sure you discuss any questions you have with your health care provider. Document Revised: 01/08/2020 Document Reviewed: 01/08/2020 Elsevier Patient Education  2021 [employer]  Breastfeeding  Choosing to  breastfeed is one of the best decisions you can make for yourself and your baby. A change in hormones during pregnancy causes your breasts to make breast milk in your milk-producing glands. Hormones prevent breast milk from being released before your baby is born. They also prompt milk flow after birth. Once breastfeeding has begun, thoughts of your baby, as well as his or her sucking or crying, can stimulate the release of milk from your milk-producing glands. Benefits of breastfeeding Research shows that breastfeeding offers many health benefits for infants and mothers. It also offers a cost-free and convenient way to feed your baby. For your baby  Your first milk (colostrum) helps your baby's digestive system to function better.  Special cells in your milk (antibodies) help your baby to fight off infections.  Breastfed babies are less likely to develop asthma, allergies, obesity, or type 2 diabetes. They are also at lower risk for sudden infant death syndrome (SIDS).  Nutrients in breast milk are better able to meet your baby's needs compared to infant formula.  Breast milk improves your baby's brain development. For you  Breastfeeding helps to create a very special bond between you and your baby.  Breastfeeding is convenient. Breast milk costs nothing and is always available at the correct temperature.  Breastfeeding helps  to burn calories. It helps you to lose the weight that you gained during pregnancy.  Breastfeeding makes your uterus return faster to its size before pregnancy. It also slows bleeding (lochia) after you give birth.  Breastfeeding helps to lower your risk of developing type 2 diabetes, osteoporosis, rheumatoid arthritis, cardiovascular disease, and breast, ovarian, uterine, and endometrial cancer later in life. Breastfeeding basics Starting breastfeeding  Find a comfortable place to sit or lie down, with your neck and back well-supported.  Place a pillow or a  rolled-up blanket under your baby to bring him or her to the level of your breast (if you are seated). Nursing pillows are specially designed to help support your arms and your baby while you breastfeed.  Make sure that your baby's tummy (abdomen) is facing your abdomen.  Gently massage your breast. With your fingertips, massage from the outer edges of your breast inward toward the nipple. This encourages milk flow. If your milk flows slowly, you may need to continue this action during the feeding.  Support your breast with 4 fingers underneath and your thumb above your nipple (make the letter "C" with your hand). Make sure your fingers are well away from your nipple and your baby's mouth.  Stroke your baby's lips gently with your finger or nipple.  When your baby's mouth is open wide enough, quickly bring your baby to your breast, placing your entire nipple and as much of the areola as possible into your baby's mouth. The areola is the colored area around your nipple. ? More areola should be visible above your baby's upper lip than below the lower lip. ? Your baby's lips should be opened and extended outward (flanged) to ensure an adequate, comfortable latch. ? Your baby's tongue should be between his or her lower gum and your breast.  Make sure that your baby's mouth is correctly positioned around your nipple (latched). Your baby's lips should create a seal on your breast and be turned out (everted).  It is common for your baby to suck about 2-3 minutes in order to start the flow of breast milk. Latching Teaching your baby how to latch onto your breast properly is very important. An improper latch can cause nipple pain, decreased milk supply, and poor weight gain in your baby. Also, if your baby is not latched onto your nipple properly, he or she may swallow some air during feeding. This can make your baby fussy. Burping your baby when you switch breasts during the feeding can help to get rid of  the air. However, teaching your baby to latch on properly is still the best way to prevent fussiness from swallowing air while breastfeeding. Signs that your baby has successfully latched onto your nipple  Silent tugging or silent sucking, without causing you pain. Infant's lips should be extended outward (flanged).  Swallowing heard between every 3-4 sucks once your milk has started to flow (after your let-down milk reflex occurs).  Muscle movement above and in front of his or her ears while sucking. Signs that your baby has not successfully latched onto your nipple  Sucking sounds or smacking sounds from your baby while breastfeeding.  Nipple pain. If you think your baby has not latched on correctly, slip your finger into the corner of your baby's mouth to break the suction and place it between your baby's gums. Attempt to start breastfeeding again. Signs of successful breastfeeding Signs from your baby  Your baby will gradually decrease the number of sucks or  will completely stop sucking.  Your baby will fall asleep.  Your baby's body will relax.  Your baby will retain a small amount of milk in his or her mouth.  Your baby will let go of your breast by himself or herself. Signs from you  Breasts that have increased in firmness, weight, and size 1-3 hours after feeding.  Breasts that are softer immediately after breastfeeding.  Increased milk volume, as well as a change in milk consistency and color by the fifth day of breastfeeding.  Nipples that are not sore, cracked, or bleeding. Signs that your baby is getting enough milk  Wetting at least 1-2 diapers during the first 24 hours after birth.  Wetting at least 5-6 diapers every 24 hours for the first week after birth. The urine should be clear or pale yellow by the age of 5 days.  Wetting 6-8 diapers every 24 hours as your baby continues to grow and develop.  At least 3 stools in a 24-hour period by the age of 5 days.  The stool should be soft and yellow.  At least 3 stools in a 24-hour period by the age of 7 days. The stool should be seedy and yellow.  No loss of weight greater than 10% of birth weight during the first 3 days of life.  Average weight gain of 4-7 oz (113-198 g) per week after the age of 4 days.  Consistent daily weight gain by the age of 5 days, without weight loss after the age of 2 weeks. After a feeding, your baby may spit up a small amount of milk. This is normal. Breastfeeding frequency and duration Frequent feeding will help you make more milk and can prevent sore nipples and extremely full breasts (breast engorgement). Breastfeed when you feel the need to reduce the fullness of your breasts or when your baby shows signs of hunger. This is called "breastfeeding on demand." Signs that your baby is hungry include:  Increased alertness, activity, or restlessness.  Movement of the head from side to side.  Opening of the mouth when the corner of the mouth or cheek is stroked (rooting).  Increased sucking sounds, smacking lips, cooing, sighing, or squeaking.  Hand-to-mouth movements and sucking on fingers or hands.  Fussing or crying. Avoid introducing a pacifier to your baby in the first 4-6 weeks after your baby is born. After this time, you may choose to use a pacifier. Research has shown that pacifier use during the first year of a baby's life decreases the risk of sudden infant death syndrome (SIDS). Allow your baby to feed on each breast as long as he or she wants. When your baby unlatches or falls asleep while feeding from the first breast, offer the second breast. Because newborns are often sleepy in the first few weeks of life, you may need to awaken your baby to get him or her to feed. Breastfeeding times will vary from baby to baby. However, the following rules can serve as a guide to help you make sure that your baby is properly fed:  Newborns (babies 107 weeks of age or  younger) may breastfeed every 1-3 hours.  Newborns should not go without breastfeeding for longer than 3 hours during the day or 5 hours during the night.  You should breastfeed your baby a minimum of 8 times in a 24-hour period. Breast milk pumping Pumping and storing breast milk allows you to make sure that your baby is exclusively fed your breast milk, even  at times when you are unable to breastfeed. This is especially important if you go back to work while you are still breastfeeding, or if you are not able to be present during feedings. Your lactation consultant can help you find a method of pumping that works best for you and give you guidelines about how long it is safe to store breast milk.      Caring for your breasts while you breastfeed Nipples can become dry, cracked, and sore while breastfeeding. The following recommendations can help keep your breasts moisturized and healthy:  Avoid using soap on your nipples.  Wear a supportive bra designed especially for nursing. Avoid wearing underwire-style bras or extremely tight bras (sports bras).  Air-dry your nipples for 3-4 minutes after each feeding.  Use only cotton bra pads to absorb leaked breast milk. Leaking of breast milk between feedings is normal.  Use lanolin on your nipples after breastfeeding. Lanolin helps to maintain your skin's normal moisture barrier. Pure lanolin is not harmful (not toxic) to your baby. You may also hand express a few drops of breast milk and gently massage that milk into your nipples and allow the milk to air-dry. In the first few weeks after giving birth, some women experience breast engorgement. Engorgement can make your breasts feel heavy, warm, and tender to the touch. Engorgement peaks within 3-5 days after you give birth. The following recommendations can help to ease engorgement:  Completely empty your breasts while breastfeeding or pumping. You may want to start by applying warm, moist heat (in  the shower or with warm, water-soaked hand towels) just before feeding or pumping. This increases circulation and helps the milk flow. If your baby does not completely empty your breasts while breastfeeding, pump any extra milk after he or she is finished.  Apply ice packs to your breasts immediately after breastfeeding or pumping, unless this is too uncomfortable for you. To do this: ? Put ice in a plastic bag. ? Place a towel between your skin and the bag. ? Leave the ice on for 20 minutes, 2-3 times a day.  Make sure that your baby is latched on and positioned properly while breastfeeding. If engorgement persists after 48 hours of following these recommendations, contact your health care provider or a Advertising copywriter. Overall health care recommendations while breastfeeding  Eat 3 healthy meals and 3 snacks every day. Well-nourished mothers who are breastfeeding need an additional 450-500 calories a day. You can meet this requirement by increasing the amount of a balanced diet that you eat.  Drink enough water to keep your urine pale yellow or clear.  Rest often, relax, and continue to take your prenatal vitamins to prevent fatigue, stress, and low vitamin and mineral levels in your body (nutrient deficiencies).  Do not use any products that contain nicotine or tobacco, such as cigarettes and e-cigarettes. Your baby may be harmed by chemicals from cigarettes that pass into breast milk and exposure to secondhand smoke. If you need help quitting, ask your health care provider.  Avoid alcohol.  Do not use illegal drugs or marijuana.  Talk with your health care provider before taking any medicines. These include over-the-counter and prescription medicines as well as vitamins and herbal supplements. Some medicines that may be harmful to your baby can pass through breast milk.  It is possible to become pregnant while breastfeeding. If birth control is desired, ask your health care provider  about options that will be safe while breastfeeding your baby. Where  to find more information: [employer]: www.llli.org Contact a health care provider if:  You feel like you want to stop breastfeeding or have become frustrated with breastfeeding.  Your nipples are cracked or bleeding.  Your breasts are red, tender, or warm.  You have: ? Painful breasts or nipples. ? A swollen area on either breast. ? A fever or chills. ? Nausea or vomiting. ? Drainage other than breast milk from your nipples.  Your breasts do not become full before feedings by the fifth day after you give birth.  You feel sad and depressed.  Your baby is: ? Too sleepy to eat well. ? Having trouble sleeping. ? More than 90 week old and wetting fewer than 6 diapers in a 24-hour period. ? Not gaining weight by 59 days of age.  Your baby has fewer than 3 stools in a 24-hour period.  Your baby's skin or the white parts of his or her eyes become yellow. Get help right away if:  Your baby is overly tired (lethargic) and does not want to wake up and feed.  Your baby develops an unexplained fever. Summary  Breastfeeding offers many health benefits for infant and mothers.  Try to breastfeed your infant when he or she shows early signs of hunger.  Gently tickle or stroke your baby's lips with your finger or nipple to allow the baby to open his or her mouth. Bring the baby to your breast. Make sure that much of the areola is in your baby's mouth. Offer one side and burp the baby before you offer the other side.  Talk with your health care provider or lactation consultant if you have questions or you face problems as you breastfeed. This information is not intended to replace advice given to you by your health care provider. Make sure you discuss any questions you have with your health care provider. Document Revised: 10/05/2017 Document Reviewed: 08/12/2016 Elsevier Patient Education  2021  Elsevier Inc.  Breast Pumping Tips Breast pumping is a way to get milk out of your breasts. You will then store the milk for your baby to use when you are away from home. There are three ways to pump.  You can use your hand to massage and squeeze your breast (hand expression).  You can use a hand-held machine to manually pump your milk.  You can use an electric machine to pump your milk. In the beginning you may not get much milk. After a few days, your breasts should make more. Pumping can help you start making milk after your baby is born. Pumping helps you to keep making milk when you are away from your baby. When should I pump? You can start pumping soon after your baby is born. Follow these tips:  When you are with your baby: ? Pump after you breastfeed. ? Pump from the free breast while you breastfeed.  When you are away from your baby: ? Pump every 2-3 hours for 15 minutes. ? Pump both breasts at the same time if you can.  If your baby drinks formula, pump around the time your baby gets the formula.  If you drank alcohol, wait 2 hours before you pump.  If you are going to have surgery, ask your doctor when you should pump again. How do I get ready to pump? Try to relax. Try these things to help your milk come in:  Smell your baby's blanket or clothes.  Look at a picture or video of  your baby.  Sit in a quiet, private space.  Place a cloth on your breast. The cloth should be warm and a little wet.  Massage your breast and nipple.  Play relaxing music.  Picture your milk flowing.  Drink water and eat a snack. What are some tips? General tips for pumping breast milk  Always wash your hands with soap and water for at least 20 seconds before pumping.  If you do not get much milk or if pumping hurts, try different pump settings or a different kind of pump.  Drink enough fluid so your pee (urine) is clear or pale yellow.  Wear clothing that opens in the front or  is easy to take off.  Pump milk into a clean bottle or container.  Do not smoke or use any products that contain nicotine or tobacco. If you need help quitting, ask your doctor.  Try to get a hands-free pumping bra, if possible. This makes it easy to pump breast milk. You can buy one or make your own.   Tips for storing breast milk  Store breast milk in a clean, BPA-free container. These include: ? A glass or plastic bottle. ? A milk storage bag.  Store only 2-4 ounces of breast milk in each container.  Swirl the breast milk in the container. Do not shake it.  Write down the date you pumped the milk on the container.  This is how long you can store breast milk: ? Room temperature: 6-8 hours. It is best to use the milk within 4 hours. ? Cooler with ice packs: 24 hours. ? Refrigerator: 5-8 days, if the milk is clean. It is best to use the milk within 3 days. ? Freezer: 9-12 months, if the milk is clean and stored away from the freezer door. It is best to use the milk within 6 months.  Put milk in the back of the refrigerator or freezer.  Thaw frozen milk using warm water. Do not use the microwave.   Tips for choosing a breast pump When choosing a pump, keep the following things in mind:  Manual breast pumps do not need electricity. They cost less. They can be hard to use.  Electric breast pumps use electricity. They are more expensive. They are easier to use. They collect more milk.  The suction cup (flange) should be the right size.  Before you buy the pump, check if your insurance will pay for it. Tips for caring for a breast pump  Check the manual that came with your pump for cleaning tips.  Try not to touch the inside of pump parts.  Clean the pump after you use it. To do this: ? Wipe down the electrical part. Use a dry cloth or paper towel. Do not put this part in water or in cleaning products. ? Wash the plastic parts with soap and warm water. Or use the dishwasher  if the manual says it is safe. You do not need to clean the tubing unless it touched breast milk. ? Let all the parts air dry. Avoid drying them with a cloth or towel. ? When the parts are clean and dry, put the pump back together. Then store the pump.  If there is water in the tubing when you want to pump: 1. Attach the tubing to the pump. 2. Turn on the pump to dry the tubing. 3. Turn off the pump when the tube is dry. Summary  Pumping can help you start making milk  after your baby is born. It lets you keep making milk when you are away from your baby.  When you are away from your baby, pump for about 15 minutes every 2-3 hours. Pump both breasts at the same time, if you can. This information is not intended to replace advice given to you by your health care provider. Make sure you discuss any questions you have with your health care provider. Document Revised: 04/21/2020 Document Reviewed: 04/21/2020 Elsevier Patient Education  2021 [employer].

## 2020-10-18 ENCOUNTER — Ambulatory Visit: Payer: Self-pay

## 2020-10-18 NOTE — Lactation Note (Signed)
This note was copied from a baby's chart. Lactation Consultation Note  Patient Name: Karen Benton OEHOZ'Y Date: 10/18/2020   Age: 25 HOL Lactation Rounds: LC to the room for a visit. Mother is pumping her left breast, baby is asleep in bassinet.  Mother states feeds are going well. LC reviewed and encouraged feeding on demand and with cues. If baby is not cueing we encourage hand expression and spoon feed to wake baby. Reviewed diaper counts for days of life and when to call Peds with questions. Reviewed "understanding Postpartum and Newborn care " INJOY booklet at bedside. Reviewed outpatient Lactation number and resources. Newmomhealth.com and [email or web address] resources, encouraged downloading an app for tracking feeds and diapers. Reviewed pacifier, pumping, and bottles are not encouraged until breastfeeding is established and going well in the first 4 weeks, unless baby is not feeding well. Mother stated understanding with all teaching.    Karen Benton Karen Benton 10/18/2020, 12:09 PM

## 2020-10-19 ENCOUNTER — Other Ambulatory Visit: Payer: Self-pay

## 2020-10-19 ENCOUNTER — Ambulatory Visit (INDEPENDENT_AMBULATORY_CARE_PROVIDER_SITE_OTHER): Payer: Medicaid Other | Admitting: Obstetrics & Gynecology

## 2020-10-19 ENCOUNTER — Encounter: Payer: Self-pay | Admitting: Obstetrics & Gynecology

## 2020-10-19 VITALS — BP 130/80 | Ht 63.0 in | Wt 237.0 lb

## 2020-10-19 DIAGNOSIS — Z98891 History of uterine scar from previous surgery: Secondary | ICD-10-CM

## 2020-10-19 NOTE — Progress Notes (Signed)
  Postoperative Follow-up Patient presents post op from CS for FTP, 4 days ago.  Subjective: Patient reports marked improvement in her preop symptoms. Eating a regular diet without difficulty. Pain is controlled with current analgesics. Medications being used: ibuprofen (OTC) and narcotic analgesics including Percocet.  Activity: normal activities of daily living. Patient reports additional symptom's since surgery of leakage from OnQ pain pump.  Breast- no redness or pain.  No depression.  Infant doing well.  Objective: BP 130/80   Ht 5\' 3"  (1.6 m)   Wt 237 lb (107.5 kg)   BMI 41.98 kg/m  Physical Exam Constitutional:      General: She is not in acute distress.    Appearance: She is well-developed.  Cardiovascular:     Rate and Rhythm: Normal rate.  Pulmonary:     Effort: Pulmonary effort is normal.  Abdominal:     General: There is no distension.     Palpations: Abdomen is soft.     Tenderness: There is no abdominal tenderness.     Comments: Incision Healing Well   Musculoskeletal:        General: Normal range of motion.  Neurological:     Mental Status: She is alert and oriented to person, place, and time.     Cranial Nerves: No cranial nerve deficit.  Skin:    General: Skin is warm and dry.   OnQ and bandages removed.  Assessment: s/p :  cesarean section progressing well  Plan: Patient has done well after surgery with no apparent complications.  I have discussed the post-operative course to date, and the expected progress moving forward.  The patient understands what complications to be concerned about.  I will see the patient in routine follow up, or sooner if needed.    Activity plan: No heavy lifting.  Pelvic rest.  10/19/2020, 11:48 AM

## 2020-10-22 ENCOUNTER — Ambulatory Visit: Payer: Medicaid Other | Admitting: Obstetrics & Gynecology

## 2020-11-26 ENCOUNTER — Ambulatory Visit: Payer: Medicaid Other | Admitting: Obstetrics & Gynecology

## 2020-12-02 ENCOUNTER — Other Ambulatory Visit: Payer: Self-pay

## 2020-12-02 ENCOUNTER — Encounter: Payer: Self-pay | Admitting: Obstetrics & Gynecology

## 2020-12-02 ENCOUNTER — Ambulatory Visit (INDEPENDENT_AMBULATORY_CARE_PROVIDER_SITE_OTHER): Payer: Medicaid Other | Admitting: Obstetrics & Gynecology

## 2020-12-02 VITALS — BP 120/70 | Ht 63.0 in | Wt 201.0 lb

## 2020-12-02 DIAGNOSIS — Z98891 History of uterine scar from previous surgery: Secondary | ICD-10-CM

## 2020-12-02 NOTE — Progress Notes (Signed)
  OBSTETRICS POSTPARTUM CLINIC PROGRESS NOTE  Subjective:     Karen Benton is a 25 y.o. G54P1001 female who presents for a postpartum visit. She is 6 weeks postpartum following a Term pregnancy or Uncomplicated pregnancy and delivery by C-section repeat; no problems after deliver.  I have fully reviewed the prenatal and intrapartum course. Anesthesia: spinal.  Postpartum course has been complicated by uncomplicated.  Baby is feeding by Breast.  Bleeding: patient has not  resumed menses.  Bowel function is normal. Bladder function is normal.  Patient is not sexually active. Contraception method desired is condoms.  Postpartum depression screening: negative. Edinburgh 4.  The following portions of the patient's history were reviewed and updated as appropriate: allergies, current medications, past family history, past medical history, past social history, past surgical history and problem list.  Review of Systems Pertinent items are noted in HPI.  Objective:    BP 120/70   Ht 5\' 3"  (1.6 m)   Wt 201 lb (91.2 kg)   BMI 35.61 kg/m   General:  alert and no distress   Breasts:  inspection negative, no nipple discharge or bleeding, no masses or nodularity palpable  Lungs: clear to auscultation bilaterally  Heart:  regular rate and rhythm, S1, S2 normal, no murmur, click, rub or gallop  Abdomen: soft, non-tender; bowel sounds normal; no masses,  no organomegaly.  Well healed Pfannenstiel incision   Vulva:  normal  Vagina: normal vagina, no discharge, exudate, lesion, or erythema  Cervix:  no cervical motion tenderness and no lesions  Corpus: normal size, contour, position, consistency, mobility, non-tender  Adnexa:  normal adnexa and no mass, fullness, tenderness  Rectal Exam: Not performed.          Assessment:  Post Partum Care visit  Plan:  See orders and Patient Instructions Contraceptive counseling for all methods, she desires condoms at this time; counseled the importance  of no pregnancy after CS for 18 mos or more Resume all normal activities Follow up in: 3 months (PAP) or as needed.   , MD, Annamarie Major Ob/Gyn, [facility] 12/02/2020  4:04 PM

## 2020-12-17 ENCOUNTER — Ambulatory Visit: Payer: Medicaid Other | Admitting: Obstetrics & Gynecology

## 2021-03-04 ENCOUNTER — Ambulatory Visit (INDEPENDENT_AMBULATORY_CARE_PROVIDER_SITE_OTHER): Payer: Medicaid Other | Admitting: Obstetrics & Gynecology

## 2021-03-04 ENCOUNTER — Other Ambulatory Visit: Payer: Self-pay

## 2021-03-04 ENCOUNTER — Encounter: Payer: Self-pay | Admitting: Obstetrics & Gynecology

## 2021-03-04 ENCOUNTER — Other Ambulatory Visit (HOSPITAL_COMMUNITY)
Admission: RE | Admit: 2021-03-04 | Discharge: 2021-03-04 | Disposition: A | Discharge status: Home / Self Care | Payer: Medicaid Other | Source: Ambulatory Visit | Attending: Obstetrics & Gynecology | Admitting: Obstetrics & Gynecology

## 2021-03-04 VITALS — BP 120/80 | Ht 63.0 in | Wt 183.0 lb

## 2021-03-04 DIAGNOSIS — Z01419 Encounter for gynecological examination (general) (routine) without abnormal findings: Secondary | ICD-10-CM | POA: Diagnosis not present

## 2021-03-04 DIAGNOSIS — Z124 Encounter for screening for malignant neoplasm of cervix: Secondary | ICD-10-CM | POA: Diagnosis not present

## 2021-03-04 NOTE — Patient Instructions (Signed)
Recommendations to boost your immunity to prevent illness such as viral flu and colds, including covid19, are as follows:       - - -  Vitamin K2 and Vitamin D3  - - - Take Vitamin K2 at 200-300 mcg daily (usually 2-3 pills daily of the over the counter formulation). Take Vitamin D3 at 3000-4000 U daily (usually 3-4 pills daily of the over the counter formulation). Studies show that these two at high normal levels in your system are very effective in keeping your immunity so strong and protective that you will be unlikely to contract viral illness such as those listed above.  Dr Cosma  Thank you for choosing [facility]. As part of our ongoing efforts to improve patient experience, we would appreciate your feedback. Please fill out the short survey that you will receive by mail or MyChart. Your opinion is important to us! -Dr Taffe   

## 2021-03-04 NOTE — Progress Notes (Signed)
HPI:      [name] is a 25 y.o. G1P1001 who LMP was Patient's last menstrual period was 12/25/2019., she presents today for her annual examination. The patient has no complaints today. The patient is sexually active. Her last pap: approximate date 2021 and was normal. The patient does perform self breast exams.  There is no notable family history of breast or ovarian cancer in her family.  The patient has regular exercise: yes.  The patient denies current symptoms of depression.    GYN History: Contraception: condoms  PMHx: Past Medical History:  Diagnosis Date   BRCA negative 01/2020   MyRisk neg   Bronchitis    Family history of breast cancer    Family history of ovarian cancer    Increased risk of breast cancer 01/2020   riskscore=25.8%/IBIS=33.8%   Past Surgical History:  Procedure Laterality Date   CESAREAN SECTION  10/15/2020   Procedure: CESAREAN SECTION;  Surgeon: Gae Dry, MD;  Location: [facility] ORS;  Service: Obstetrics;;   Family History  Problem Relation Age of Onset   Breast cancer Mother 43       Aggressive Stage 4-4x   Ovarian cancer Mother 61   Breast cancer Maternal Grandmother 57   Skin cancer Maternal Grandfather 62       Facial   Breast cancer Maternal Aunt 60   Social History   Tobacco Use   Smoking status: Never   Smokeless tobacco: Never  Vaping Use   Vaping Use: Never used  Substance Use Topics   Alcohol use: Yes   Drug use: Never   No current outpatient medications on file. Allergies: Patient has no known allergies.  Review of Systems  Constitutional:  Negative for chills, fever and malaise/fatigue.  HENT:  Negative for congestion, sinus pain and sore throat.   Eyes:  Negative for blurred vision and pain.  Respiratory:  Negative for cough and wheezing.   Cardiovascular:  Negative for chest pain and leg swelling.  Gastrointestinal:  Negative for abdominal pain, constipation, diarrhea, heartburn, nausea and vomiting.   Genitourinary:  Negative for dysuria, frequency, hematuria and urgency.  Musculoskeletal:  Negative for back pain, joint pain, myalgias and neck pain.  Skin:  Negative for itching and rash.  Neurological:  Negative for dizziness, tremors and weakness.  Endo/Heme/Allergies:  Does not bruise/bleed easily.  Psychiatric/Behavioral:  Negative for depression. The patient is not nervous/anxious and does not have insomnia.    Objective: BP 120/80   Ht 5' 3"  (1.6 m)   Wt 183 lb (83 kg)   LMP 12/25/2019   Breastfeeding Yes   BMI 32.42 kg/m   Filed Weights   03/04/21 1002  Weight: 183 lb (83 kg)   Body mass index is 32.42 kg/m. Physical Exam Constitutional:      General: She is not in acute distress.    Appearance: She is well-developed.  Genitourinary:     Bladder, rectum and urethral meatus normal.     No lesions in the vagina.     Right Labia: No rash, tenderness or lesions.    Left Labia: No tenderness, lesions or rash.    No vaginal bleeding.      Right Adnexa: not tender and no mass present.    Left Adnexa: not tender and no mass present.    No cervical motion tenderness, friability, lesion or polyp.     Uterus is not enlarged.     No uterine mass detected.  Pelvic exam was performed with patient in the lithotomy position.  Breasts:    Right: No mass, skin change or tenderness.     Left: No mass, skin change or tenderness.  HENT:     Head: Normocephalic and atraumatic. No laceration.     Right Ear: Hearing normal.     Left Ear: Hearing normal.     Mouth/Throat:     Pharynx: Uvula midline.  Eyes:     Pupils: Pupils are equal, round, and reactive to light.  Neck:     Thyroid: No thyromegaly.  Cardiovascular:     Rate and Rhythm: Normal rate and regular rhythm.     Heart sounds: No murmur heard.   No friction rub. No gallop.  Pulmonary:     Effort: Pulmonary effort is normal. No respiratory distress.     Breath sounds: Normal breath sounds. No wheezing.   Abdominal:     General: Bowel sounds are normal. There is no distension.     Palpations: Abdomen is soft.     Tenderness: There is no abdominal tenderness. There is no rebound.  Musculoskeletal:        General: Normal range of motion.     Cervical back: Normal range of motion and neck supple.  Neurological:     Mental Status: She is alert and oriented to person, place, and time.     Cranial Nerves: No cranial nerve deficit.  Skin:    General: Skin is warm and dry.  Psychiatric:        Judgment: Judgment normal.  Vitals reviewed.    Assessment:  ANNUAL EXAM 1. Women's annual routine gynecological examination   2. Screening for cervical cancer      Screening Plan:            1.  Cervical Screening-  Pap smear done today  Also advised for HPV vaccine  She is unclear of she has had this done, and will check into this  2. Breast screening- Exam annually and mammogram>40 planned   3. Colonoscopy every 10 years, Hemoccult testing - after age 66  4. Labs  due next year  5. Counseling for contraception: condoms  Upstream - 03/04/21 1004       Pregnancy Intention Screening   Does the patient want to become pregnant in the next year? No    Does the patient's partner want to become pregnant in the next year? No    Would the patient like to discuss contraceptive options today? No      Contraception Wrap Up   Current Method No Contraceptive Precautions    Contraception Counseling Provided No            The pregnancy intention screening data noted above was reviewed. Potential methods of contraception were discussed. The patient elected to proceed with Female Condom.      F/U  Return in about 1 year (around 03/04/2022) for Annual.  Barnett Applebaum, MD, [facility], [facility] 03/04/2021  10:28 AM

## 2021-03-05 LAB — CYTOLOGY - PAP
Chlamydia: NEGATIVE
Comment: NEGATIVE
Comment: NEGATIVE
Comment: NORMAL
Diagnosis: NEGATIVE
Neisseria Gonorrhea: NEGATIVE
Trichomonas: NEGATIVE

## 2021-04-27 ENCOUNTER — Other Ambulatory Visit: Payer: Self-pay

## 2021-04-27 ENCOUNTER — Ambulatory Visit (INDEPENDENT_AMBULATORY_CARE_PROVIDER_SITE_OTHER): Payer: Medicaid Other | Admitting: Obstetrics

## 2021-04-27 ENCOUNTER — Encounter: Payer: Self-pay | Admitting: Obstetrics

## 2021-04-27 VITALS — BP 120/64 | Ht 63.0 in | Wt 186.0 lb

## 2021-04-27 DIAGNOSIS — R102 Pelvic and perineal pain: Secondary | ICD-10-CM

## 2021-04-27 LAB — POCT URINALYSIS DIPSTICK
Leukocytes, UA: NEGATIVE
Protein, UA: POSITIVE — AB

## 2021-04-27 NOTE — Progress Notes (Signed)
Obstetrics & Gynecology Office Visit   Chief Complaint:  Chief Complaint  Patient presents with   Vaginal Pain    Stabbing pain in vaginal area, right side pelvic pressure    History of Present Illness: Karen Benton presents today with a s/o acute right sided adnexal pain that started yesterday. She is a 25 yo patient, a G1P1, who had a primary CS in March for failed IOL.  She is not using birth control, although they use the "pull out" method. She noticed the pain when she got up in the morning, and describes it as "stabbing" and ongoing.She denies any recent IC, denies vaginal bleeding, and her LMP is 03/30/2021.  The pain was intense enough that she did not go to work. She considered heading to the ED, but took several Tylenol, and this has helped somewhat.  She has had 1/2 bottle of water today. Denies any constipation or GI pain.   Review of Systems:  Review of Systems  Constitutional: Negative.   HENT: Negative.    Eyes: Negative.   Respiratory: Negative.    Cardiovascular: Negative.   Gastrointestinal:  Positive for abdominal pain.       Abdoinal pain is very low and closer to her right adnexal area.  Genitourinary: Negative.   Musculoskeletal: Negative.   Skin: Negative.   Neurological: Negative.   Endo/Heme/Allergies: Negative.   Psychiatric/Behavioral: Negative.      Past Medical History:  Past Medical History:  Diagnosis Date   BRCA negative 01/2020   MyRisk neg   Bronchitis    Family history of breast cancer    Family history of ovarian cancer    Increased risk of breast cancer 01/2020   riskscore=25.8%/IBIS=33.8%    Past Surgical History:  Past Surgical History:  Procedure Laterality Date   CESAREAN SECTION  10/15/2020   Procedure: CESAREAN SECTION;  Surgeon: Gae Dry, MD;  Location: [facility] ORS;  Service: Obstetrics;;    Gynecologic History: Patient's last menstrual period was 03/30/2021 (exact date).  Obstetric History: G1P1001  Family History:   Family History  Problem Relation Age of Onset   Breast cancer Mother 52       Aggressive Stage 4-4x   Ovarian cancer Mother 40   Breast cancer Maternal Grandmother 77   Skin cancer Maternal Grandfather 80       Facial   Breast cancer Maternal Aunt 60   Stroke Cousin        x2    Social History:  Social History   Socioeconomic History   Marital status: Single    Spouse name: Not on file   Number of children: Not on file   Years of education: Not on file   Highest education level: Not on file  Occupational History   Not on file  Tobacco Use   Smoking status: Never   Smokeless tobacco: Never  Vaping Use   Vaping Use: Never used  Substance and Sexual Activity   Alcohol use: Yes   Drug use: Never   Sexual activity: Yes    Birth control/protection: None  Other Topics Concern   Not on file  Social History Narrative   ** Merged History Encounter **       Social Determinants of Health   Financial Resource Strain: Not on file  Food Insecurity: Not on file  Transportation Needs: Not on file  Physical Activity: Not on file  Stress: Not on file  Social Connections: Not on file  Intimate Partner Violence: Not  on file    Allergies:  No Known Allergies  Medications: Prior to Admission medications   Not on File    Physical Exam Vitals:  Vitals:   04/27/21 1457  BP: 120/64   Patient's last menstrual period was 03/30/2021 (exact date).  Physical Exam Constitutional:      Appearance: Normal appearance. She is obese.  HENT:     Head: Normocephalic and atraumatic.  Cardiovascular:     Rate and Rhythm: Normal rate and regular rhythm.  Pulmonary:     Effort: Pulmonary effort is normal.     Breath sounds: Normal breath sounds.  Abdominal:     General: Bowel sounds are normal.     Palpations: Abdomen is soft.  Genitourinary:    General: Normal vulva.     Rectum: Normal.     Comments: Exteran genitalia without lesions or irritation. Normal vaginal rugae, no  enlarge glands noted. On bimanual exam, she si tender to deeper palpation in the right adnexal area, not in the left. No CMT noted and no vaginal discharge or bleeding noted. Her uterus is non enlarged, anteverted. Some possible "thickening" in the right adnexal area. Musculoskeletal:        General: Normal range of motion.     Cervical back: Normal range of motion and neck supple.  Skin:    General: Skin is warm and dry.  Neurological:     General: No focal deficit present.     Mental Status: She is alert and oriented to person, place, and time.  Psychiatric:        Mood and Affect: Mood normal.        Behavior: Behavior normal.   Results for orders placed or performed in visit on 04/27/21 (from the past 24 hour(s))  POCT Urinalysis Dipstick     Status: Abnormal   Collection Time: 04/27/21  4:29 PM  Result Value Ref Range   Color, UA     Clarity, UA     Glucose, UA     Bilirubin, UA     Ketones, UA     Spec Grav, UA     Blood, UA 3+    pH, UA     Protein, UA Positive (A) Negative   Urobilinogen, UA     Nitrite, UA     Leukocytes, UA Negative Negative   Appearance     Odor       Assessment: 25 y.o. G1P1001 with acute onset of right adnexal pain History of primary cesaread section   Plan: Problem List Items Addressed This Visit   None Visit Diagnoses     Adnexal pain    -  Primary   Relevant Orders   US PELVIS (TRANSABDOMINAL ONLY)   POCT Urinalysis Dipstick (Completed)      We will send her for a pelvic sono. She is advised to take Ibuprofen 600 mg po q 6 hours today and tomorrow. We discussed that the pain might be an ovarian cyst, or Mittelschmerz (ovulation Pain) And that she will RTC in 1-2 weeks for an MD visit. Should her pain not improve, or if she has any vaginal bleeding, she is to go to the ED and/or call the [facility] office.  She verbalizes understanding.  Imagene Riches, CNM  04/27/2021 5:39 PM

## 2021-04-28 ENCOUNTER — Ambulatory Visit
Admission: RE | Admit: 2021-04-28 | Discharge: 2021-04-28 | Disposition: A | Discharge status: Home / Self Care | Payer: Medicaid Other | Source: Ambulatory Visit | Attending: Obstetrics and Gynecology | Admitting: Obstetrics and Gynecology

## 2021-04-28 ENCOUNTER — Telehealth: Payer: Self-pay | Admitting: Obstetrics and Gynecology

## 2021-04-28 DIAGNOSIS — R102 Pelvic and perineal pain: Secondary | ICD-10-CM | POA: Insufficient documentation

## 2021-04-28 NOTE — Addendum Note (Signed)
Addended by: Althea Grimmer B on: 04/28/2021 08:30 AM   Modules accepted: Orders

## 2021-04-28 NOTE — Telephone Encounter (Signed)
Pt aware of neg GYN u/s results for RLQ pain. No urin or GI sx. Hurts with walking and pressing on area. No fevers. Pain is same intensity as yesterday. Took ibup today with sx improvement for an hr.  Discussed MSK as etiology for pain vs possible appendicitis. Pt to go to ED if sx persist/worsen for further eval. No Gyn etiology for pain. Cont NSAIDs. F/u prn.

## 2021-05-01 ENCOUNTER — Encounter: Payer: Self-pay | Admitting: Emergency Medicine

## 2021-05-01 ENCOUNTER — Emergency Department: Payer: Medicaid Other

## 2021-05-01 ENCOUNTER — Emergency Department
Admission: EM | Admit: 2021-05-01 | Discharge: 2021-05-01 | Disposition: A | Discharge status: Home / Self Care | Payer: Medicaid Other | Attending: Emergency Medicine | Admitting: Emergency Medicine

## 2021-05-01 ENCOUNTER — Other Ambulatory Visit: Payer: Self-pay

## 2021-05-01 DIAGNOSIS — R1031 Right lower quadrant pain: Secondary | ICD-10-CM | POA: Diagnosis present

## 2021-05-01 DIAGNOSIS — R197 Diarrhea, unspecified: Secondary | ICD-10-CM | POA: Insufficient documentation

## 2021-05-01 DIAGNOSIS — N201 Calculus of ureter: Secondary | ICD-10-CM

## 2021-05-01 DIAGNOSIS — N132 Hydronephrosis with renal and ureteral calculous obstruction: Secondary | ICD-10-CM | POA: Diagnosis not present

## 2021-05-01 LAB — URINALYSIS, COMPLETE (UACMP) WITH MICROSCOPIC
Bacteria, UA: NONE SEEN
Bilirubin Urine: NEGATIVE
Glucose, UA: NEGATIVE mg/dL
Ketones, ur: NEGATIVE mg/dL
Leukocytes,Ua: NEGATIVE
Nitrite: NEGATIVE
Protein, ur: 30 mg/dL — AB
Specific Gravity, Urine: 1.031 — ABNORMAL HIGH (ref 1.005–1.030)
pH: 5 (ref 5.0–8.0)

## 2021-05-01 LAB — COMPREHENSIVE METABOLIC PANEL
ALT: 12 U/L (ref 0–44)
AST: 13 U/L — ABNORMAL LOW (ref 15–41)
Albumin: 4.1 g/dL (ref 3.5–5.0)
Alkaline Phosphatase: 54 U/L (ref 38–126)
Anion gap: 5 (ref 5–15)
BUN: 20 mg/dL (ref 6–20)
CO2: 29 mmol/L (ref 22–32)
Calcium: 9.4 mg/dL (ref 8.9–10.3)
Chloride: 104 mmol/L (ref 98–111)
Creatinine, Ser: 0.75 mg/dL (ref 0.44–1.00)
GFR, Estimated: >60 mL/min (ref >60)
Glucose, Bld: 101 mg/dL — ABNORMAL HIGH (ref 70–99)
Potassium: 4.1 mmol/L (ref 3.5–5.1)
Sodium: 138 mmol/L (ref 135–145)
Total Bilirubin: 0.7 mg/dL (ref 0.3–1.2)
Total Protein: 7.1 g/dL (ref 6.5–8.1)

## 2021-05-01 LAB — CBC
HCT: 42.4 % (ref 36.0–46.0)
Hemoglobin: 14.4 g/dL (ref 12.0–15.0)
MCH: 30.1 pg (ref 26.0–34.0)
MCHC: 34 g/dL (ref 30.0–36.0)
MCV: 88.5 fL (ref 80.0–100.0)
Platelets: 290 10*3/uL (ref 150–400)
RBC: 4.79 MIL/uL (ref 3.87–5.11)
RDW: 12.2 % (ref 11.5–15.5)
WBC: 7.3 10*3/uL (ref 4.0–10.5)
nRBC: 0 % (ref 0.0–0.2)

## 2021-05-01 LAB — POC URINE PREG, ED: Preg Test, Ur: NEGATIVE

## 2021-05-01 LAB — LIPASE, BLOOD: Lipase: 40 U/L (ref 11–51)

## 2021-05-01 MED ORDER — IOHEXOL 350 MG/ML SOLN
100.0000 mL | Freq: Once | INTRAVENOUS | Status: AC | PRN
  Administered 2021-05-01: 100 mL via INTRAVENOUS

## 2021-05-01 MED ORDER — NAPROXEN 250 MG PO TABS: 500.0000 mg | ORAL_TABLET | Freq: Two times a day (BID) | ORAL | 0 refills | Status: AC

## 2021-05-01 MED ORDER — TAMSULOSIN HCL 0.4 MG PO CAPS: 0.4000 mg | ORAL_CAPSULE | Freq: Every day | ORAL | 0 refills | Status: AC

## 2021-05-01 MED ORDER — OXYCODONE-ACETAMINOPHEN 5-325 MG PO TABS
1.0000 | ORAL_TABLET | Freq: Once | ORAL | Status: AC
  Administered 2021-05-01: 1 via ORAL
  Filled 2021-05-01: qty 1

## 2021-05-01 MED ORDER — KETOROLAC TROMETHAMINE 30 MG/ML IJ SOLN
15.0000 mg | Freq: Once | INTRAMUSCULAR | Status: AC
  Administered 2021-05-01: 15 mg via INTRAVENOUS
  Filled 2021-05-01: qty 1

## 2021-05-01 NOTE — ED Triage Notes (Signed)
Pt via POV from home. Pt RLQ abd pain and vaginal pain since Saturday. Pt also endorses diarrhea. Pt states that she is also having burning with urination. Denies vaginal bleeding. Denies NV.  Denies any abd surgeries other than a c-section. Pt is A&Ox4 and NAD.

## 2021-05-01 NOTE — Discharge Instructions (Addendum)
You have a small kidney stone in the right ureter which is causing your symptoms.  It is small enough that you will likely pass this on your own.  However you should follow-up with urology in about 1 week for further assessment.  You may continue to have pain intermittently for several weeks.  Please return to the emergency department if you are unable to eat or drink due to severe nausea and vomiting or you develop fevers.

## 2021-05-01 NOTE — ED Provider Notes (Signed)
[facility]  ____________________________________________   Event Date/Time   First MD Initiated Contact with Patient 05/01/21 0901     (approximate)  I have reviewed the triage vital signs and the nursing notes.   HISTORY  Chief Complaint Abdominal Pain and Vaginal Pain    HPI SHENISE WOLGAMOTT is a 25 y.o. female with no significant pmh who presents with RLQ pain.  Symptoms started about a week ago.  Patient endorses pain in the low right lower quadrant that is constant and throbbing, intermittently sharp.  She initially saw her GYN about 3 days ago who ordered a pelvic ultrasound and did a bimanual exam.  Ultrasound showed normal ovaries no torsion or cyst.  She has had ongoing pain since that time which she feels is worsening.  Started with some diarrhea yesterday as well as burning with urination.  She denies flank pain fevers chills nausea or vomiting.         Past Medical History:  Diagnosis Date   Born by cesarean section 10/15/2020   BRCA negative 01/2020   MyRisk neg   Bronchitis    Failure to progress in labor 10/15/2020   Family history of breast cancer    Family history of ovarian cancer    Increased risk of breast cancer 01/2020   riskscore=25.8%/IBIS=33.8%    There are no problems to display for this patient.   Past Surgical History:  Procedure Laterality Date   CESAREAN SECTION  10/15/2020   Procedure: CESAREAN SECTION;  Surgeon: Gae Dry, MD;  Location: [facility] ORS;  Service: Obstetrics;;    Prior to Admission medications   Not on File    Allergies Patient has no known allergies.  Family History  Problem Relation Age of Onset   Breast cancer Mother 46       Aggressive Stage 4-4x   Ovarian cancer Mother 38   Breast cancer Maternal Grandmother 69   Skin cancer Maternal Grandfather 48       Facial   Breast cancer Maternal Aunt 60   Stroke Cousin        x2    Social History Social History   Tobacco Use    Smoking status: Never   Smokeless tobacco: Never  Vaping Use   Vaping Use: Never used  Substance Use Topics   Alcohol use: Yes   Drug use: Never    Review of Systems   Review of Systems  Constitutional:  Negative for appetite change, chills and fever.  Gastrointestinal:  Positive for abdominal pain and diarrhea. Negative for nausea and vomiting.  Genitourinary:  Positive for dysuria, pelvic pain and vaginal pain. Negative for vaginal bleeding and vaginal discharge.  All other systems reviewed and are negative.  Physical Exam Updated Vital Signs BP 126/76 (BP Location: Left Arm)   Pulse 88   Temp 98 F (36.7 C) (Oral)   Resp 18   Ht _0  (1.6 m)   Wt 84.4 kg   LMP 03/30/2021 (Exact Date)   SpO2 100%   Breastfeeding Yes   BMI 32.95 kg/m   Physical Exam Vitals and nursing note reviewed.  Constitutional:      General: She is not in acute distress.    Appearance: Normal appearance.  HENT:     Head: Normocephalic and atraumatic.  Eyes:     General: No scleral icterus.    Conjunctiva/sclera: Conjunctivae normal.  Pulmonary:     Effort: Pulmonary effort is normal. No respiratory distress.  Breath sounds: No stridor.  Abdominal:     General: Abdomen is flat.     Palpations: Abdomen is soft.     Comments: Tenderness to palpation in the right upper quadrant, tenderness to palpation in the right lower quadrant with no guarding  Musculoskeletal:        General: No deformity or signs of injury.     Cervical back: Normal range of motion.  Skin:    General: Skin is dry.     Coloration: Skin is not jaundiced or pale.  Neurological:     General: No focal deficit present.     Mental Status: She is alert and oriented to person, place, and time. Mental status is at baseline.  Psychiatric:        Mood and Affect: Mood normal.        Behavior: Behavior normal.     LABS (all labs ordered are listed, but only abnormal results are displayed)  Labs Reviewed  CBC  LIPASE,  BLOOD  COMPREHENSIVE METABOLIC PANEL  URINALYSIS, COMPLETE (UACMP) WITH MICROSCOPIC  POC URINE PREG, ED   ____________________________________________  EKG  N/a ____________________________________________  RADIOLOGY Almeta Monas, personally viewed and evaluated these images (plain radiographs) as part of my medical decision making, as well as reviewing the written report by the radiologist.  ED MD interpretation: I reviewed the CT scan which shows a 3 mm stone in the right ureter with some proximal obstruction    ____________________________________________   PROCEDURES  Procedure(s) performed (including Critical Care):  Procedures   ____________________________________________   INITIAL IMPRESSION / [place] / ED COURSE     Patient is a 25 year old female presenting with abdominal pain x1 week.  She has already had a pelvic ultrasound done 3 days ago ordered by her OB/GYN who is concerned about ovarian pathology.  The ultrasound did not show any abnormalities.  She has ongoing pain now with some burning with urination and diarrhea.  Vital signs within normal limits.  She is very well-appearing.  Abdomen is benign but she is tender primarily in the right lower quadrant with some right upper quadrant tenderness as well.  We will obtain labs hCG and urinalysis as well as a CT abdomen pelvis to rule out appendicitis however given her exam and the timeframe my suspicion is lower.  Labs are all reassuring, no leukocytosis.  Urine does have RBCs.  CT of the abdomen shows a 3 mm right ureteral stone with some proximal hydronephrosis.  UA has 6-10 white blood cells but negative leuks negative nitrites and no bacteria are seen.  Urine is not consistent with infection and her presentation is not consistent with infected stone.  On reassessment pain is somewhat worse.  She already has received Toradol, will give a dose of Percocet.  Will dc with Flomax and NSAIDs.   Advised the patient to follow-up with urology.      ____________________________________________   FINAL CLINICAL IMPRESSION(S) / ED DIAGNOSES  Final diagnoses:  None     ED Discharge Orders     None        Note:  This document was prepared using Dragon voice recognition software and may include unintentional dictation errors.    Rada Hay, MD 05/01/21 1104

## 2021-05-03 ENCOUNTER — Ambulatory Visit: Payer: Medicaid Other | Admitting: Obstetrics & Gynecology

## 2021-05-11 ENCOUNTER — Ambulatory Visit: Payer: Medicaid Other | Admitting: Obstetrics & Gynecology

## 2021-05-11 ENCOUNTER — Other Ambulatory Visit: Payer: Medicaid Other

## 2022-02-19 IMAGING — US US PELVIS COMPLETE WITH TRANSVAGINAL
1 series · 13 of 25 positions shown · non-contrast
Comparison: 01/08/2020

CLINICAL DATA: RIGHT lower quadrant and adnexal pain for 3 days,
breast feeding, LMP 03/30/2021

EXAM:
TRANSABDOMINAL AND TRANSVAGINAL ULTRASOUND OF PELVIS
TECHNIQUE: Both transabdominal and transvaginal ultrasound examinations of the
pelvis were performed. Transabdominal technique was performed for
global imaging of the pelvis including uterus, ovaries, adnexal
regions, and pelvic cul-de-sac. It was necessary to proceed with
endovaginal exam following the transabdominal exam to visualize the
endometrium and ovaries.

[Series 1: us pelvic complete with transvaginal · 126 acquisitions, 13 frames shown]
[im 1/126]
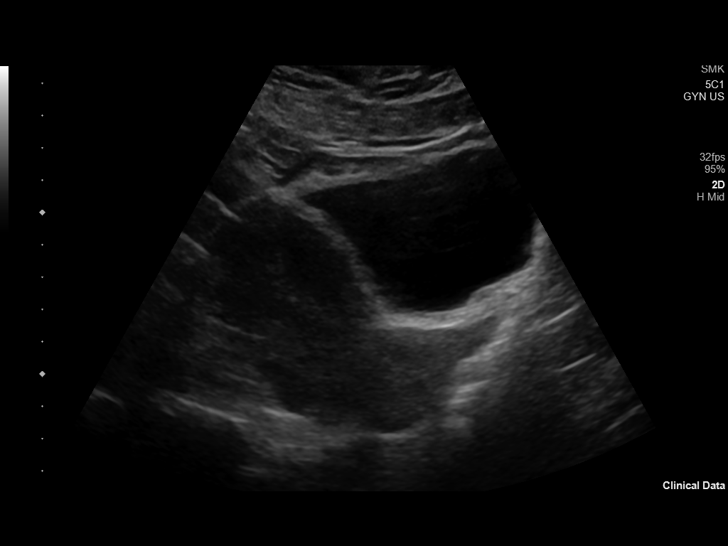
[im 11/126]
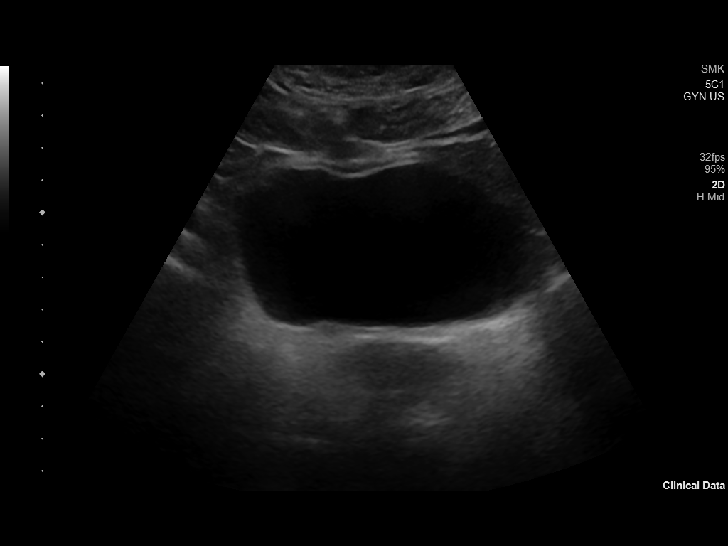
[im 21/126]
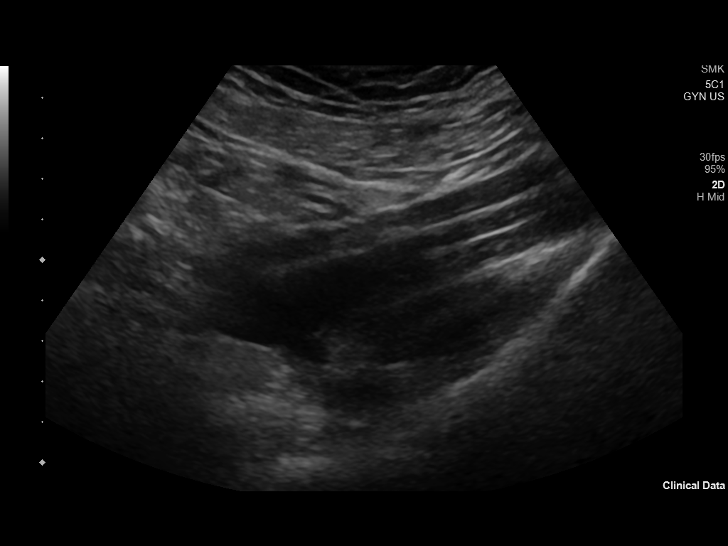
[im 32/126]
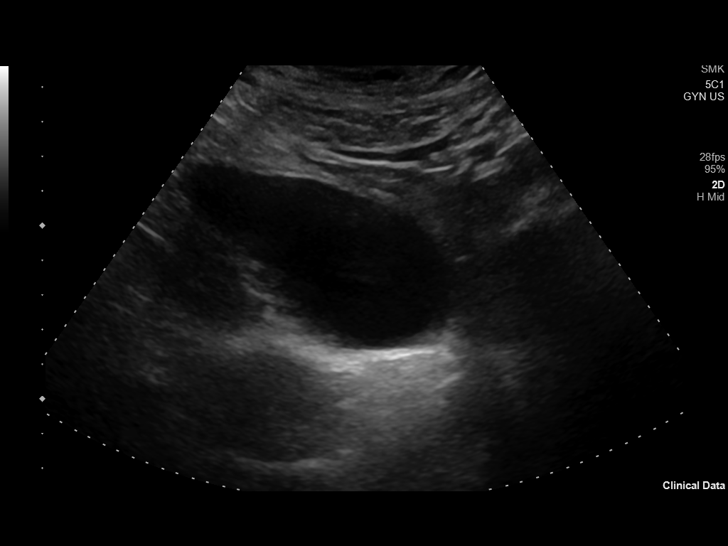
[im 42/126]
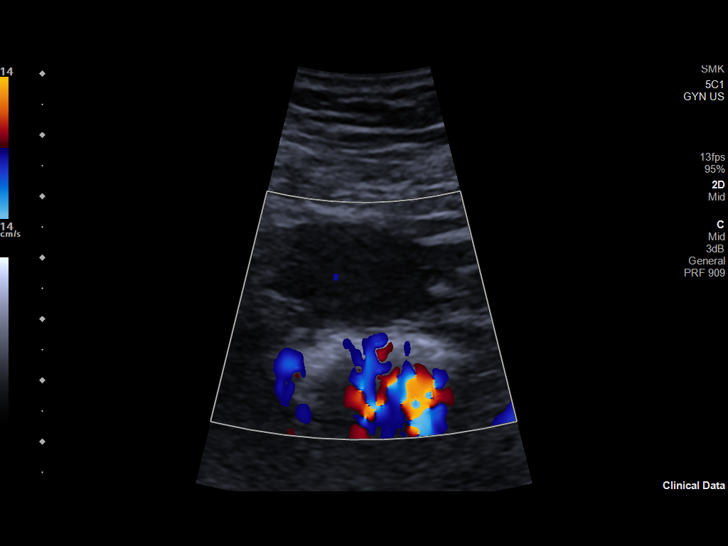
[im 53/126]
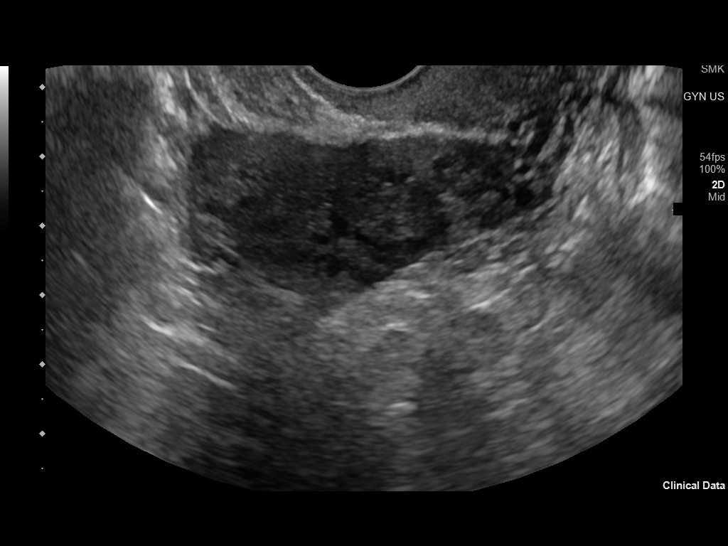
[im 63/126]
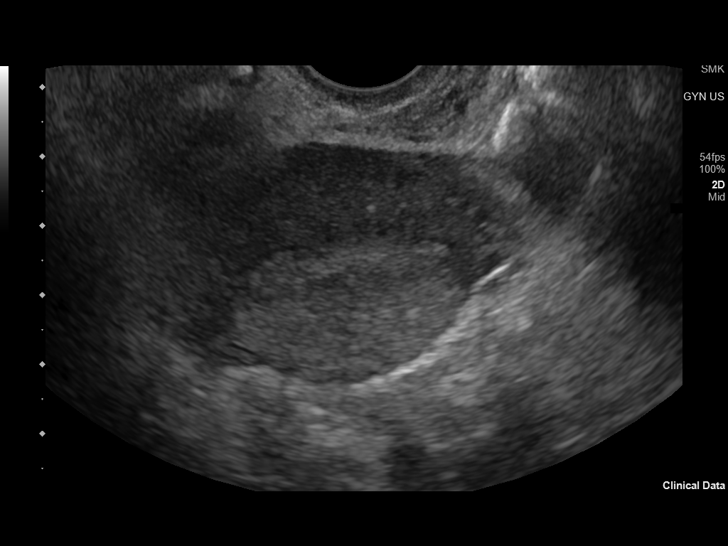
[im 73/126]
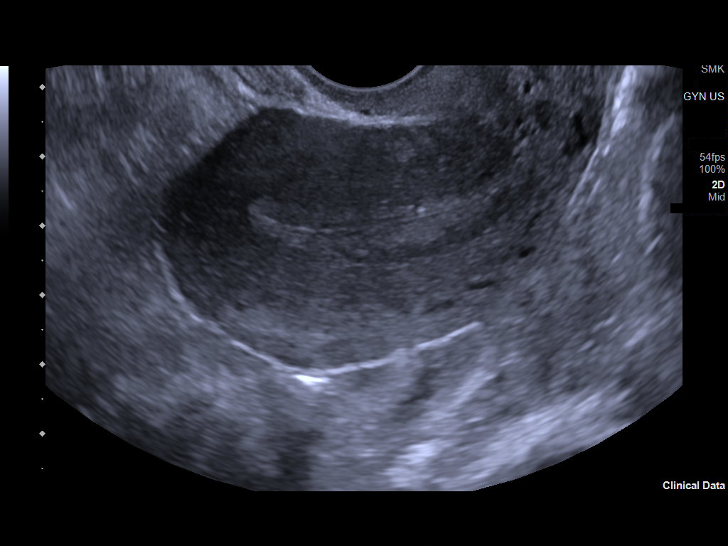
[im 84/126]
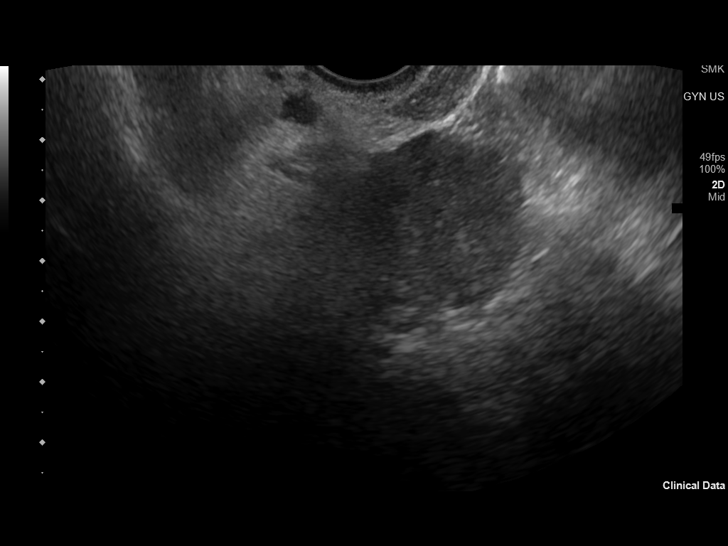
[im 94/126]
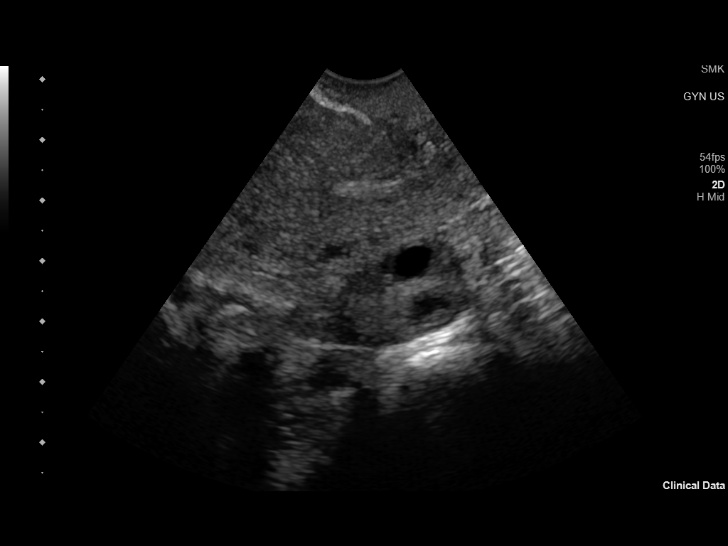
[im 105/126]
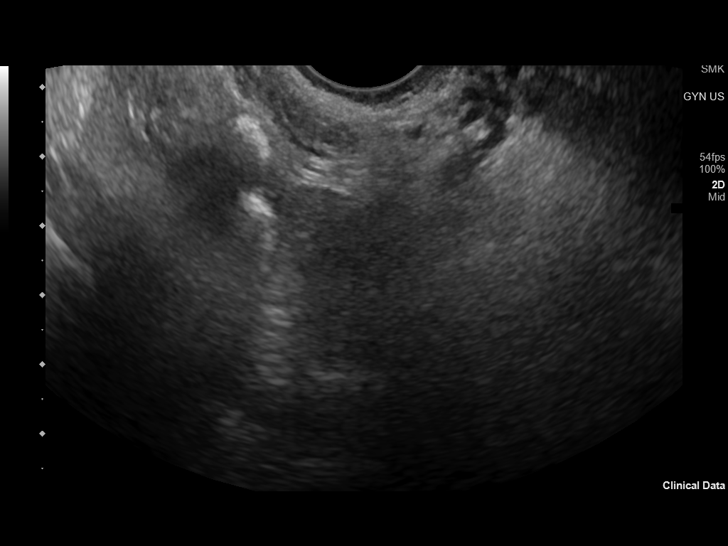
[im 115/126]
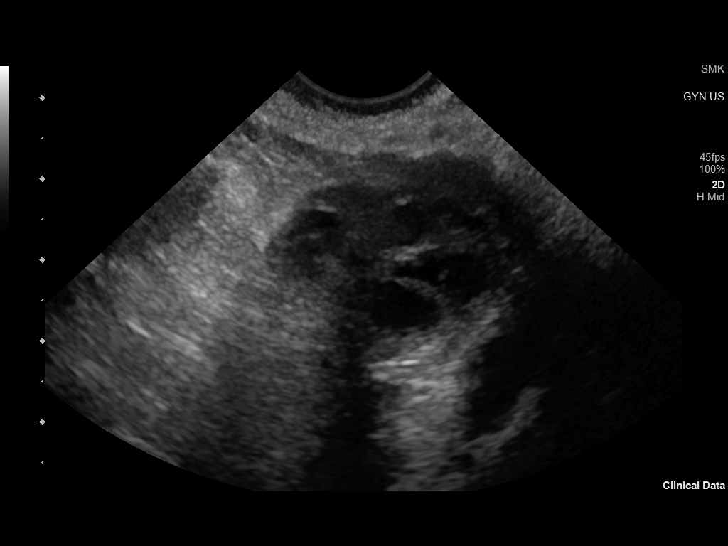
[im 126/126]
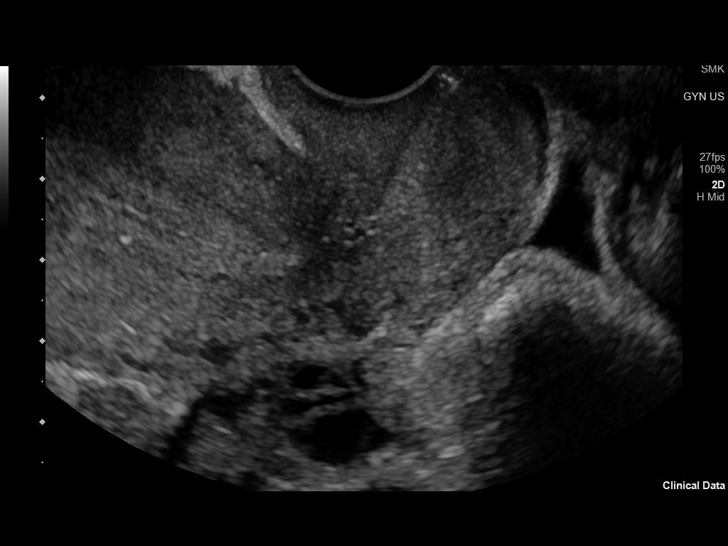

[13 of 25 positions shown; findings below may reference images not displayed]

FINDINGS: Uterus

Measurements: 7.1 x 3.9 x 4.3 cm = volume: 62 mL. Anteverted. Normal
myometrial echogenicity and morphology without mass.

Endometrium

Thickness: 7 mm. Small nonshadowing echogenic focus at basal aspect
of upper endometrium posteriorly, 3 mm diameter, suspect basilar
calcification. No mass or fluid.

Right ovary

Measurements: 3.6 x 1.6 x 2.6 cm = volume: 7.9 mL. Normal morphology
without mass. Internal blood flow present on color Doppler imaging.

Left ovary

Measurements: 4.0 x 2.1 x 1.8 cm = volume: 7.6 mL. Normal morphology
without mass. Blood flow present internally on color Doppler
imaging.

Other findings

Trace free pelvic fluid.  No adnexal masses.
IMPRESSION: No significant pelvic sonographic abnormalities.

Small basilar endometrial calcification 3 mm diameter at upper
uterus.

## 2022-03-01 ENCOUNTER — Ambulatory Visit: Payer: Medicaid Other | Admitting: Family Medicine

## 2022-03-24 ENCOUNTER — Encounter: Payer: Self-pay | Admitting: Family Medicine

## 2022-03-24 ENCOUNTER — Ambulatory Visit (INDEPENDENT_AMBULATORY_CARE_PROVIDER_SITE_OTHER): Payer: Medicaid Other | Admitting: Family Medicine

## 2022-03-24 ENCOUNTER — Ambulatory Visit: Payer: Medicaid Other | Admitting: Obstetrics

## 2022-03-24 VITALS — BP 114/70 | HR 90 | Temp 97.4°F | Ht 63.0 in | Wt 197.8 lb

## 2022-03-24 DIAGNOSIS — Z803 Family history of malignant neoplasm of breast: Secondary | ICD-10-CM

## 2022-03-24 DIAGNOSIS — R519 Headache, unspecified: Secondary | ICD-10-CM

## 2022-03-24 NOTE — Patient Instructions (Signed)
It was a pleasure meeting you today. Thank you for allowing me to take part in your health care.  Our goals for today as we discussed include:  For your headache Journal your headaches daily Increase water intake to 64 oz daily Eat three meals a day Get at least 8 hours of sleep a night Do not take medications to relieve headache more than 2-3 times a week Stress management will help with headaches If you have any neck stiffness, fevers, slurred speech, or visual changes please call 911 or go to Emergency  Please follow-up with PCP in 5-6 weeks  If you have any questions or concerns, please do not hesitate to call the office at [phone].  I look forward to our next visit and until then take care and stay safe.  Regards,   Dana Allan, MD   [facility]   What can I do to improve my insomnia? -- You can follow good "sleep hygiene." That means that you: ?Sleep only long enough to feel rested and then get out of bed ?Go to bed and get up at the same time every day ?Do not try to force yourself to sleep. If you can't sleep, get out of bed and try again later. ?Have coffee, tea, and other foods that have caffeine only in the morning ?Avoid alcohol in the late afternoon, evening, and bedtime ?Avoid smoking, especially in the evening ?Keep your bedroom dark, cool, quiet, and free of reminders of work or other things that cause you stress ?Solve problems you have before you go to bed ?Exercise several days a week, but not right before bed ?Avoid looking at phones or reading devices ("e-books") that give off light before bed. This can make it harder to fall asleep. Other things that can improve sleep include: ?Relaxation therapy, in which you focus on relaxing all the muscles in your body 1 by 1 ?Working with a Conservator, museum/gallery to deal with the problems that might be causing poor sleep  Sleep hygiene guidelines Recommendation Details  Regular bedtime and  rise time Having a consistent bedtime and rise time leads to more regular sleep schedules and avoids periods of sleep deprivation or periods of extended wakefulness during the night.  Avoid napping Avoid napping, especially naps lasting longer than 1 hour and naps late in the day.  Limit caffeine Avoid caffeine after lunch. The time between lunch and bedtime represents approximately 2 half-lives for caffeine, and this time window allows for most caffeine to be metabolized before bedtime.  Limit alcohol Recommendations are typically focused on avoiding alcohol near bedtime. Alcohol is initially sedating, but activating as it is metabolized. Alcohol also negatively impacts sleep architecture.  Avoid nicotine Nicotine is a stimulant and should be avoided near bedtime and at night.  Exercise Daytime physical activity is encouraged, in particular, 4 to 6 hours before bedtime, as this may facilitate sleep onset. Rigorous exercise within 2 hours of bedtime is discouraged.  Keep the sleep environment quiet and dark Noise and light exposure during the night can disrupt sleep. White noise or ear plugs are often recommended to reduce noise. Using blackout shades or an eye mask is commonly recommended to reduce light. This may also include avoiding exposure to television or technology near bedtime, as this can have an impact on circadian rhythms by shifting sleep timing later.   Bedroom clock Avoid checking the time at night. This includes alarm clocks and other time pieces (eg, watches and smart phones).  Checking the time increases cognitive arousal and prolongs wakefulness.  Evening eating Avoid a large meal near bedtime, but don't go to bed hungry. Eat a healthy and filling meal in the evening and avoid late-night snacks.

## 2022-03-24 NOTE — Progress Notes (Signed)
    SUBJECTIVE:   CHIEF COMPLAINT / HPI: to establish care  Patient presents to clinic to establish care with new PCP  Headaches Chronic.  Intermittent.  Tylenol or Ibuprofen has helped at times.  Has not been sleeping well. Does not not eat three meals daily.  Cares for 25 year old, has full time employment.  Denies any fevers, neck stiffness, photophobia, nausea, vomiting, weakness, numbness or tingling.  No slurred speech, facial drooping or dizziness.  History of breast cancer Patient reports started screening at age 74.  Recently restarted 2 years ago.  Followed with [facility].  Reports yearly MRI breast.  Last PAP 01/22 wnl  PERTINENT  PMH / PSH:  Chronic Bronchitis Nephrolithiasis  Dog bite repair in childhood C-Section x1  Mom- Breast Ca, deceased at age 93.  Triple negative  Dad- MI, passed at age 80 Maternal GM- Breast Ca  Former Tobacco use quit 2019 EtoH 1-2x month THC since 2019 No Cocaine/Heroin  Employed   OBJECTIVE:   BP 114/70 (BP Location: Left Arm, Patient Position: Sitting, Cuff Size: Normal)   Pulse 90   Temp (!) 97.4 F (36.3 C) (Oral)   Ht 5\' 3"  (1.6 m)   Wt 197 lb 12.8 oz (89.7 kg)   LMP 03/19/2022   SpO2 99%   BMI 35.04 kg/m    General: Alert, no acute distress Cardio: Normal S1 and S2, RRR, no r/m/g Pulm: CTAB, normal work of breathing Abdomen: Bowel sounds normal. Abdomen soft and non-tender.  Extremities: No peripheral edema.  Neuro: Cranial nerves grossly intact   ASSESSMENT/PLAN:   Family history of breast cancer in first degree relative Mother with triple negative aggressive breast cancer, passed at age 63.  Maternal GM with breast ca.  Could not find any imaging or documentation of recent imaging. -Follow up with OBGYN to discuss if would benefit from genetics evaluation  Frequent headaches Suspect headaches secondary to lifestyle, lack of sleep, nutrition and stress.  No focal deficits on exam.  -Sleep  hygiene -Monitor headaches and journal activity surrounding headaches, including triggers, if needing to take medications and timing of headaches -Do not medicate wit tylenol or Ibuprofen more than 2-3 times a week -Follow up in 5-6 weeks or sooner if symptoms worsen -Strict return precautions provided   HCM Follow up with HPV vaccines Last PAP 2022, NILM.   Request records from [place]    PDMP reviewed  [place], MD   Total of 35 minutes spent with patient, greater than 50% of time spent face to face on counseling and coordination of care, specifically breast cancer screening, headache management, sleep hygiene, stress management and documentation.

## 2022-04-03 ENCOUNTER — Encounter: Payer: Self-pay | Admitting: Family Medicine

## 2022-04-03 DIAGNOSIS — Z803 Family history of malignant neoplasm of breast: Secondary | ICD-10-CM | POA: Insufficient documentation

## 2022-04-03 DIAGNOSIS — R519 Headache, unspecified: Secondary | ICD-10-CM | POA: Insufficient documentation

## 2022-04-03 NOTE — Assessment & Plan Note (Signed)
Suspect headaches secondary to lifestyle, lack of sleep, nutrition and stress.  No focal deficits on exam.  -Sleep hygiene -Monitor headaches and journal activity surrounding headaches, including triggers, if needing to take medications and timing of headaches -Do not medicate wit tylenol or Ibuprofen more than 2-3 times a week -Follow up in 5-6 weeks or sooner if symptoms worsen -Strict return precautions provided

## 2022-04-03 NOTE — Assessment & Plan Note (Signed)
Mother with triple negative aggressive breast cancer, passed at age 25.  Maternal GM with breast ca.  Could not find any imaging or documentation of recent imaging. -Follow up with OBGYN to discuss if would benefit from genetics evaluation

## 2022-04-05 ENCOUNTER — Other Ambulatory Visit: Payer: Self-pay

## 2022-04-05 ENCOUNTER — Emergency Department
Admission: EM | Admit: 2022-04-05 | Discharge: 2022-04-06 | Disposition: A | Discharge status: Home / Self Care | Payer: Medicaid Other | Attending: Emergency Medicine | Admitting: Emergency Medicine

## 2022-04-05 DIAGNOSIS — R519 Headache, unspecified: Secondary | ICD-10-CM | POA: Diagnosis present

## 2022-04-05 NOTE — ED Triage Notes (Signed)
Patient arrived via EMS from home reporting occipital headache x 10 hours which progressed with time and now with severe  right neck pain. Denies injury and reports history of headaches. Reports photophobia. Denies nausea or vomiting but states she had one episode of profuse drooling prior to arrival. Denies numbness or tingling or focal weakness. AOX4, resp even, unlabored on RA. States she took 250 mg of Tylenol 40 min PTA with no relief.

## 2022-04-06 ENCOUNTER — Emergency Department: Payer: Medicaid Other

## 2022-04-06 LAB — CBC WITH DIFFERENTIAL/PLATELET
Abs Immature Granulocytes: 0.03 10*3/uL (ref 0.00–0.07)
Basophils Absolute: 0 10*3/uL (ref 0.0–0.1)
Basophils Relative: 1 %
Eosinophils Absolute: 0.1 10*3/uL (ref 0.0–0.5)
Eosinophils Relative: 2 %
HCT: 43.1 % (ref 36.0–46.0)
Hemoglobin: 14.3 g/dL (ref 12.0–15.0)
Immature Granulocytes: 0 %
Lymphocytes Relative: 42 %
Lymphs Abs: 3.4 10*3/uL (ref 0.7–4.0)
MCH: 29.4 pg (ref 26.0–34.0)
MCHC: 33.2 g/dL (ref 30.0–36.0)
MCV: 88.7 fL (ref 80.0–100.0)
Monocytes Absolute: 0.6 10*3/uL (ref 0.1–1.0)
Monocytes Relative: 8 %
Neutro Abs: 3.9 10*3/uL (ref 1.7–7.7)
Neutrophils Relative %: 47 %
Platelets: 265 10*3/uL (ref 150–400)
RBC: 4.86 MIL/uL (ref 3.87–5.11)
RDW: 12 % (ref 11.5–15.5)
WBC: 8.1 10*3/uL (ref 4.0–10.5)
nRBC: 0 % (ref 0.0–0.2)

## 2022-04-06 LAB — HCG, QUANTITATIVE, PREGNANCY: hCG, Beta Chain, Quant, S: <1 m[IU]/mL (ref <5)

## 2022-04-06 LAB — BASIC METABOLIC PANEL
Anion gap: 5 (ref 5–15)
BUN: 17 mg/dL (ref 6–20)
CO2: 26 mmol/L (ref 22–32)
Calcium: 9 mg/dL (ref 8.9–10.3)
Chloride: 107 mmol/L (ref 98–111)
Creatinine, Ser: 0.73 mg/dL (ref 0.44–1.00)
GFR, Estimated: >60 mL/min (ref >60)
Glucose, Bld: 100 mg/dL — ABNORMAL HIGH (ref 70–99)
Potassium: 3.7 mmol/L (ref 3.5–5.1)
Sodium: 138 mmol/L (ref 135–145)

## 2022-04-06 MED ORDER — IOHEXOL 350 MG/ML SOLN
75.0000 mL | Freq: Once | INTRAVENOUS | Status: AC | PRN
  Administered 2022-04-06: 75 mL via INTRAVENOUS

## 2022-04-06 MED ORDER — DIPHENHYDRAMINE HCL 25 MG PO CAPS: 50.0000 mg | ORAL_CAPSULE | Freq: Four times a day (QID) | ORAL | 0 refills | Status: DC | PRN

## 2022-04-06 MED ORDER — KETOROLAC TROMETHAMINE 15 MG/ML IJ SOLN
15.0000 mg | Freq: Once | INTRAMUSCULAR | Status: AC
  Administered 2022-04-06: 15 mg via INTRAVENOUS
  Filled 2022-04-06: qty 1

## 2022-04-06 MED ORDER — METOCLOPRAMIDE HCL 10 MG PO TABS: 10.0000 mg | ORAL_TABLET | Freq: Four times a day (QID) | ORAL | 0 refills | Status: DC | PRN

## 2022-04-06 MED ORDER — DIPHENHYDRAMINE HCL 50 MG/ML IJ SOLN
25.0000 mg | Freq: Once | INTRAMUSCULAR | Status: AC
  Administered 2022-04-06: 25 mg via INTRAVENOUS
  Filled 2022-04-06: qty 1

## 2022-04-06 MED ORDER — SODIUM CHLORIDE 0.9 % IV BOLUS
1000.0000 mL | Freq: Once | INTRAVENOUS | Status: AC
  Administered 2022-04-06: 1000 mL via INTRAVENOUS

## 2022-04-06 MED ORDER — METOCLOPRAMIDE HCL 5 MG/ML IJ SOLN
10.0000 mg | INTRAMUSCULAR | Status: AC
  Administered 2022-04-06: 10 mg via INTRAVENOUS
  Filled 2022-04-06: qty 2

## 2022-04-06 NOTE — ED Provider Notes (Signed)
[facility] Provider Note    Event Date/Time   First MD Initiated Contact with Patient 04/05/22 2335     (approximate)   History   Chief Complaint: Headache   HPI  Karen Benton is a 26 y.o. female with a history of recurrent headaches who comes ED complaining of headache at the back of the head and neck that started about 1:00 PM, worse with neck movement.  Associated with photophobia as well.  She went home, took Tylenol and rested and it seemed to start feeling better.  Tonight she was lying on the floor at home with her head on a pillow watching TV when she had sudden severe worsening of the pain.  Hurts in the lower neck radiating up into the posterior head, bilateral but right much greater than left.  Denies any recent injuries.  No falls.  No change in balance or coordination.  Denies paresthesias or motor weakness or vision changes.  No fever.  Patient does report that the current headache feels new and different from her usual headache pattern.     Physical Exam   Triage Vital Signs: ED Triage Vitals  Enc Vitals Group     BP 04/05/22 2337 128/85     Pulse Rate 04/05/22 2337 69     Resp 04/05/22 2337 20     Temp 04/05/22 2337 98.4 F (36.9 C)     Temp Source 04/05/22 2337 Oral     SpO2 04/05/22 2337 97 %     Weight 04/05/22 2339 198 lb (89.8 kg)     Height 04/05/22 2339 5\' 4"  (1.626 m)     Head Circumference --      Peak Flow --      Pain Score 04/05/22 2338 5     Pain Loc --      Pain Edu? --      Excl. in GC? --     Most recent vital signs: Vitals:   04/06/22 0100 04/06/22 0300  BP: 106/67 101/66  Pulse: 66 67  Resp: 13 13  Temp:    SpO2: 99% 97%    General: Awake, no distress.  CV:  Good peripheral perfusion.  Regular rate and rhythm Resp:  Normal effort.  Abd:  No distention.  Other:  No drift, no sensory loss.  No facial droop.  Midline tongue protrusion.  Symmetric eyebrow raise.  This.  There is mild asymmetry of the  pupils, both reactive with intact concentric reflex. No midline spinal tenderness.  There is tenseness and tenderness along the posterior cervical paraspinal musculature which reproduces symptoms.  ED Results / Procedures / Treatments   Labs (all labs ordered are listed, but only abnormal results are displayed) Labs Reviewed  BASIC METABOLIC PANEL - Abnormal; Notable for the following components:      Result Value   Glucose, Bld 100 (*)    All other components within normal limits  CBC WITH DIFFERENTIAL/PLATELET  HCG, QUANTITATIVE, PREGNANCY     EKG Interpreted by me Sinus rhythm rate of 67.  Normal axis, normal intervals.  Normal QRS ST segments and T waves.  No ischemic changes.   RADIOLOGY CT head/neck interpreted by me, no ICH or obvious infarct. Radiology report reviewed   PROCEDURES:  Procedures   MEDICATIONS ORDERED IN ED: Medications  ketorolac (TORADOL) 15 MG/ML injection 15 mg (15 mg Intravenous Given 04/06/22 0028)  metoCLOPramide (REGLAN) injection 10 mg (10 mg Intravenous Given 04/06/22 0030)  diphenhydrAMINE (BENADRYL) injection  25 mg (25 mg Intravenous Given 04/06/22 0026)  sodium chloride 0.9 % bolus 1,000 mL (1,000 mLs Intravenous New Bag/Given 04/06/22 0025)  iohexol (OMNIPAQUE) 350 MG/ML injection 75 mL (75 mLs Intravenous Contrast Given 04/06/22 0124)     IMPRESSION / MDM / ASSESSMENT AND PLAN / ED COURSE  I reviewed the triage vital signs and the nursing notes.                              Differential diagnosis includes, but is not limited to, vertebral dissection/occlusion, tension headache, cervical strain, migraine headache  Patient's presentation is most consistent with acute presentation with potential threat to life or bodily function.  Patient presents with severe head and neck pain, relatively rapid onset, right greater than left.  Neurologically intact except for mild pupillary asymmetry.  This is different from her usual headache pattern  although appears to be a migraine.  We will need to obtain a CT angiogram of the head and neck to evaluate for vascular injury.  We will give IV fluids, Toradol Benadryl and Reglan in the meantime for symptom relief.   Clinical Course as of 04/06/22 0329  Wed Apr 06, 2022  0230 CTA head neck normal.  Labs normal. [PS]    Clinical Course User Index [PS] Sharman Cheek, MD     ----------------------------------------- 3:28 AM on 04/06/2022 -----------------------------------------  Headache resolved. Workup reassuring. Stable for DC.  FINAL CLINICAL IMPRESSION(S) / ED DIAGNOSES   Final diagnoses:  Headache disorder     Rx / DC Orders   ED Discharge Orders          Ordered    diphenhydrAMINE (BENADRYL) 25 mg capsule  Every 6 hours PRN        04/06/22 0327    metoCLOPramide (REGLAN) 10 MG tablet  Every 6 hours PRN        04/06/22 0327             Note:  This document was prepared using Dragon voice recognition software and may include unintentional dictation errors.   Sharman Cheek, MD 04/06/22 [phone]

## 2022-04-06 NOTE — Discharge Instructions (Addendum)
Your CT scan of the head and neck today was normal.

## 2022-04-21 ENCOUNTER — Ambulatory Visit: Payer: Medicaid Other | Admitting: Family Medicine

## 2022-04-27 ENCOUNTER — Ambulatory Visit: Payer: Medicaid Other | Admitting: Family Medicine

## 2022-04-28 ENCOUNTER — Ambulatory Visit: Payer: Self-pay | Admitting: Obstetrics

## 2022-05-12 ENCOUNTER — Ambulatory Visit: Payer: Medicaid Other | Admitting: Family Medicine

## 2022-05-20 ENCOUNTER — Ambulatory Visit (INDEPENDENT_AMBULATORY_CARE_PROVIDER_SITE_OTHER): Payer: Medicaid Other | Admitting: Obstetrics

## 2022-05-20 ENCOUNTER — Other Ambulatory Visit (HOSPITAL_COMMUNITY)
Admission: RE | Admit: 2022-05-20 | Discharge: 2022-05-20 | Disposition: A | Discharge status: Home / Self Care | Payer: Medicaid Other | Source: Ambulatory Visit | Attending: Obstetrics | Admitting: Obstetrics

## 2022-05-20 ENCOUNTER — Encounter: Payer: Self-pay | Admitting: Obstetrics

## 2022-05-20 VITALS — BP 109/81 | HR 91 | Ht 64.0 in | Wt 199.0 lb

## 2022-05-20 DIAGNOSIS — Z01419 Encounter for gynecological examination (general) (routine) without abnormal findings: Secondary | ICD-10-CM

## 2022-05-20 DIAGNOSIS — Z124 Encounter for screening for malignant neoplasm of cervix: Secondary | ICD-10-CM

## 2022-05-20 DIAGNOSIS — Z23 Encounter for immunization: Secondary | ICD-10-CM

## 2022-05-20 NOTE — Progress Notes (Addendum)
Gynecology Annual Exam  PCP: Carollee Leitz, MD  Chief Complaint:  Chief Complaint  Patient presents with   Annual Exam    Pt has been having some abdominal pain, since she had her son 2020    History of Present Illness:  [name] is a 26 y.o. G1P1001 who LMP was Patient's last menstrual period was 05/06/2022 (exact date)., presents today for her annual examination.  Her menses are regular every 28-30 days, lasting 6 day(s).  Dysmenorrhea mild, occurring first 1-2 days of flow. She does not have intermenstrual bleeding. [place] is partnered. She had a primary CS in 2022. She would desire a VBAC should she get pregnant . She is single partner, contraception - none.  Last Pap: March 04, 2021  Results were: no abnormalities /neg HPV DNA  Hx of STDs: none  There is no FH of breast cancer. There is no FH of ovarian cancer. The patient does not do self-breast exams.  Tobacco use: The patient denies current or previous tobacco use. Alcohol use: social drinker Exercise: not active    The patient wears seatbelts: yes.   The patient reports that domestic violence in her life is absent.   Past Medical History:  Diagnosis Date   Allergy    Born by cesarean section 10/15/2020   BRCA negative 01/2020   MyRisk neg   Bronchitis    Failure to progress in labor 10/15/2020   Family history of breast cancer    Family history of ovarian cancer    Increased risk of breast cancer 01/2020   riskscore=25.8%/IBIS=33.8%   Migraine     Past Surgical History:  Procedure Laterality Date   CESAREAN SECTION  10/15/2020   Procedure: CESAREAN SECTION;  Surgeon: Gae Dry, MD;  Location: [facility] ORS;  Service: Obstetrics;;    Prior to Admission medications   Not on File    No Known Allergies  Gynecologic History: Patient's last menstrual period was 05/06/2022 (exact date). History of abnormal pap smear: No History of STI: No   Obstetric History: G1P1001  hx of primary Cesarean  section in 2022  Social History   Socioeconomic History   Marital status: Single    Spouse name: Not on file   Number of children: Not on file   Years of education: Not on file   Highest education level: Not on file  Occupational History   Occupation: Restaurant manager, fast food: [employer]  Tobacco Use   Smoking status: Former    Types: Cigarettes   Smokeless tobacco: Never  Vaping Use   Vaping Use: Never used  Substance and Sexual Activity   Alcohol use: Yes    Alcohol/week: 1.0 standard drink of alcohol    Types: 1 Glasses of wine per week    Comment: occasional/once a month   Drug use: Never   Sexual activity: Yes    Birth control/protection: None  Other Topics Concern   Not on file  Social History Narrative   ** Merged History Encounter **       Social Determinants of Health   Financial Resource Strain: Not on file  Food Insecurity: Not on file  Transportation Needs: Not on file  Physical Activity: Not on file  Stress: Not on file  Social Connections: Not on file  Intimate Partner Violence: Not on file    Family History  Problem Relation Age of Onset   Breast cancer Mother 63       Aggressive Stage 4-4x  Ovarian cancer Mother 54   Breast cancer Maternal Aunt 39   Stroke Maternal Uncle    Breast cancer Maternal Grandmother 50   Skin cancer Maternal Grandfather 33       Facial   Stroke Cousin        x2    Review of Systems  Constitutional: Negative.   HENT: Negative.    Eyes: Negative.   Respiratory: Negative.    Cardiovascular: Negative.   Gastrointestinal: Negative.   Genitourinary: Negative.   Musculoskeletal: Negative.   Skin: Negative.   Neurological: Negative.   Endo/Heme/Allergies: Negative.   Psychiatric/Behavioral:  Positive for depression.        PHQ is9 GAD is 11      Physical Exam BP 109/81   Pulse 91   Ht _0  (1.626 m)   Wt 90.3 kg   LMP 05/06/2022 (Exact Date)   BMI 34.16 kg/m    Physical  Exam Constitutional:      Appearance: Normal appearance. She is obese.  Genitourinary:     Genitourinary Comments: No external lesions or irritation Normal vaginal rugae and mucosa. No obvious discharge. Uterus is non enlarged, anteverted, No adnexal tenderness.  HENT:     Head: Normocephalic and atraumatic.  Cardiovascular:     Rate and Rhythm: Normal rate and regular rhythm.     Pulses: Normal pulses.     Heart sounds: Normal heart sounds.  Pulmonary:     Effort: Pulmonary effort is normal.     Breath sounds: Normal breath sounds.  Abdominal:     Palpations: Abdomen is soft.     Comments: Higher BMI/body habitus  Musculoskeletal:        General: Normal range of motion.     Cervical back: Normal range of motion and neck supple.  Neurological:     General: No focal deficit present.     Mental Status: She is alert and oriented to person, place, and time.  Skin:    General: Skin is warm and dry.  Psychiatric:     Comments: See socres on PHQ and GAD. Not presently under treatment. Declines medication.  Vitals and nursing note reviewed.     Female chaperone present for pelvic and breast  portions of the physical exam  Results:  PHQ-9: 9   Assessment: 26 y.o. G21P1001 female here for routine annual gynecologic examination Pap smear. Does not desire formal contraception No obvious abdominal pain  on exam today Plan: Problem List Items Addressed This Visit   None Visit Diagnoses     Women's annual routine gynecological examination    -  Primary   Need for HPV vaccination       Relevant Orders   HPV 9-valent vaccine,Recombinat   Cervical cancer screening           Screening: -- Blood pressure screen normal -- Weight screening: obese: discussed management options, including lifestyle, dietary, and exercise. -- Depression screening negative (PHQ-9) -- Nutrition: normal -- cholesterol screening: not due for screening -- osteoporosis screening: not due -- tobacco  screening: not using -- alcohol screening: AUDIT questionnaire indicates low-risk usage. -- family history of breast cancer screening: done. not at high risk. -- no evidence of domestic violence or intimate partner violence. -- STD screening: gonorrhea/chlamydia NAAT not collected per patient request. -- pap smear collected per ASCCP guidelines -- flu vaccine  declined -- HPV vaccination series:  uncertain whether she has received this. We discussed her PHQ and GAD scores. Offered future visits to  address. Encouraged to see her PCP as well. She is encouraged to be on a daily multivitamin as she is not contracepting and to decrease risk for Neural tube defect in future pregnancy. Imagene Riches, CNM  05/20/2022 11:04 AM   05/20/2022 11:04 AM

## 2022-05-24 LAB — CYTOLOGY - PAP: Diagnosis: NEGATIVE

## 2022-07-20 ENCOUNTER — Ambulatory Visit: Payer: Medicaid Other

## 2022-07-21 NOTE — Progress Notes (Deleted)
    NURSE VISIT NOTE  Subjective:    Patient ID: Karen Benton, female    DOB: Mar 02, 1996, 26 y.o.   MRN: [id number]  HPI  Patient is a 26 y.o. G63P1001 female who presents for her second Gardasil injection. Order to administer given by Paula Compton CNM on 05/20/2022 .   Objective:    There were no vitals taken for this visit.  26 y.o. LMP:  ***  Contraception:  {CCO Contraception:21020264} Given by: {AOB Clinical [id number] Site:  {left/right:311354} deltoid  Lab Review  @THIS  VISIT ONLY@  Assessment:   No diagnosis found.   Plan:   Patient will return in {Gardasil Return Visit:28539}*** for {FIRST SECOND THIRD:18671} injection.    , CMA

## 2022-07-22 ENCOUNTER — Ambulatory Visit: Payer: Medicaid Other

## 2023-01-23 ENCOUNTER — Encounter: Payer: Self-pay | Admitting: Licensed Practical Nurse

## 2023-01-23 ENCOUNTER — Ambulatory Visit (INDEPENDENT_AMBULATORY_CARE_PROVIDER_SITE_OTHER): Payer: Self-pay | Admitting: Licensed Practical Nurse

## 2023-01-23 VITALS — BP 119/85 | HR 82 | Ht 63.0 in | Wt 213.7 lb

## 2023-01-23 DIAGNOSIS — Z30011 Encounter for initial prescription of contraceptive pills: Secondary | ICD-10-CM

## 2023-01-23 DIAGNOSIS — N926 Irregular menstruation, unspecified: Secondary | ICD-10-CM

## 2023-01-23 MED ORDER — NORETHINDRONE 0.35 MG PO TABS: 1.0000 | ORAL_TABLET | Freq: Every day | ORAL | 11 refills | Status: AC

## 2023-01-23 NOTE — Progress Notes (Signed)
SUBJECTIVE: Here for evaluation for missed period.  She last had a cycle the first week of May. She normally gets her cycle the first week of each month (admits she did miss her cycle in January and feels it was related to stress). She did take the OTC contraception O Pill (progestin only) from March to early April. She stopped the O pill because she felt it was giving her cramps.  She has been feeling crampy like her cycle is about to start. She has taken a pregnancy test weekly for the last four weeks, all have been negative. She does not desire a pregnancy, she would like birth control. She is not interested in the IUD or implant. She has been at her current weight for about 1 year. She does get Migraines without aura, denies CHTN or tobacco use, her BMI is 37.    OBJECTIVE: BP 119/85   Pulse 82   Ht 5\' 3"  (1.6 m)   Wt 213 lb 11.2 oz (96.9 kg)   LMP 12/19/2022 (Approximate)   Breastfeeding Unknown   BMI 37.86 kg/m   Gen: NAD PE no necessary for purpose of visit  ASSESSMENT: Missed period Need for contraception  PLAN: -reviewed 1 missed cycle may not be too concerning, we ned more data to see what her pattern is. She is carrying extra weight, which could contribute to irregular periods. Will do Beta today to be sure she is not pregnant.   -Given her BMI estrogen is not recommended, Reviewed all options of Progesterone only methods. Pt would like to try POP's, Micronor is a different formula than the O Pill, she may have a better experience with this.   -fu PRN, Annual due in October  Carie Caddy, [place]  [facility]  01/23/23  12:03 PM

## 2023-01-24 LAB — BETA HCG QUANT (REF LAB): hCG Quant: <1 m[IU]/mL

## 2023-02-06 ENCOUNTER — Ambulatory Visit: Payer: Self-pay | Admitting: Obstetrics

## 2024-08-06 ENCOUNTER — Ambulatory Visit (LOCAL_COMMUNITY_HEALTH_CENTER): Payer: Self-pay

## 2024-08-06 VITALS — BP 111/66 | Ht 63.0 in | Wt 233.5 lb

## 2024-08-06 DIAGNOSIS — Z3201 Encounter for pregnancy test, result positive: Secondary | ICD-10-CM

## 2024-08-06 DIAGNOSIS — Z3009 Encounter for other general counseling and advice on contraception: Secondary | ICD-10-CM

## 2024-08-06 LAB — PREGNANCY, URINE: Preg Test, Ur: POSITIVE — AB

## 2024-08-06 MED ORDER — PRENATAL 27-0.8 MG PO TABS: 1.0000 | ORAL_TABLET | Freq: Every day | ORAL | Status: AC

## 2024-08-06 NOTE — Progress Notes (Signed)
 UPT positive. Plans prenatal care at [facility]. Positive preg packet given and reviewed.   The patient was dispensed prenatal vitamins #100 today per SO Dr JAYSON Helling. I provided counseling today regarding the medication. We discussed the medication, the side effects and when to call clinic. Patient given the opportunity to ask questions. Questions answered.    Sent to clerk for presumptive eligibility/medicaid/preg women. Denisa Enterline, RN

## 2024-08-19 ENCOUNTER — Telehealth: Payer: Self-pay

## 2024-08-19 DIAGNOSIS — Z3480 Encounter for supervision of other normal pregnancy, unspecified trimester: Secondary | ICD-10-CM | POA: Insufficient documentation

## 2024-08-19 DIAGNOSIS — Z3689 Encounter for other specified antenatal screening: Secondary | ICD-10-CM

## 2024-08-19 DIAGNOSIS — O219 Vomiting of pregnancy, unspecified: Secondary | ICD-10-CM

## 2024-08-19 MED ORDER — ONDANSETRON 4 MG PO TBDP: 4.0000 mg | ORAL_TABLET | Freq: Four times a day (QID) | ORAL | 0 refills | Status: AC | PRN

## 2024-08-19 NOTE — Progress Notes (Signed)
 New OB Intake  I connected with  Karen  D Benton on 08/19/24 at 10:15 AM EST by Mychart Video and verified that I am speaking with the correct person using two identifiers. Nurse is located at [facility] and pt is located at home.  I discussed the limitations, risks, security and privacy concerns of performing an evaluation and management service by telephone and the availability of in person appointments. I also discussed with the patient that there may be a patient responsible charge related to this service. The patient expressed understanding and agreed to proceed.  I explained I am completing New OB Intake today. We discussed her EDD of 04/07/2024 that is based on LMP of 07/01/2024. Pt is G2/P1. I reviewed her allergies, medications, Medical/Surgical/OB history, and appropriate screenings. There are cats in the home: yes. If yes: Indoor. Based on history, this is a/an pregnancy uncomplicated . Her obstetrical history is significant for n/a.  Patient Active Problem List   Diagnosis Date Noted   Family history of breast cancer in first degree relative 04/03/2022   Frequent headaches 04/03/2022    Concerns addressed today:   Delivery Plans:  Plans to deliver at [facility].  Anatomy US  Explained first scheduled US  will be 09/09/2024. Anatomy US  will be scheduled around [redacted] weeks gestational age.  Labs Discussed genetic screening with patient. Patient does want genetic testing to be drawn at new OB visit. Discussed possible labs to be drawn at new OB appointment.  COVID Vaccine Patient has not had COVID vaccine.   Social Determinants of Health Food Insecurity: denies food insecurity Childcare: Discussed no children allowed at ultrasound appointments.   First visit review I reviewed new OB appt with pt. I explained she will have blood work and pap smear/pelvic exam if indicated. Explained pt will be seen by [name] Cookey, CNM at first visit; encounter routed to  appropriate provider.   Burnard LITTIE Ro, CMA 08/19/2024  10:11 AM

## 2024-08-20 ENCOUNTER — Telehealth: Payer: Self-pay | Admitting: Licensed Practical Nurse

## 2024-08-20 NOTE — Telephone Encounter (Signed)
 Reached out to Karen Benton to schedule NOB appt with LMD.  (LMP:  07/01/2024)  [name] has availability on 3/5 at 9:55.Left message for Karen Benton to call back,

## 2024-09-09 ENCOUNTER — Other Ambulatory Visit: Payer: Self-pay
